# Patient Record
Sex: Male | Born: 1975 | Race: White | Hispanic: No | Marital: Married | Smoking: Never smoker
Health system: Southern US, Community
[De-identification: ages and names within clinical notes are randomized; demographics above are authoritative.]

## PROBLEM LIST (undated history)

## (undated) DIAGNOSIS — I10 Essential (primary) hypertension: Secondary | ICD-10-CM

## (undated) HISTORY — DX: Essential (primary) hypertension: I10

## (undated) HISTORY — PX: TONSILLECTOMY AND ADENOIDECTOMY: SUR1326

---

## 2002-01-08 DIAGNOSIS — I10 Essential (primary) hypertension: Secondary | ICD-10-CM

## 2002-01-08 HISTORY — DX: Essential (primary) hypertension: I10

## 2006-03-13 ENCOUNTER — Ambulatory Visit: Payer: Self-pay | Admitting: Family Medicine

## 2006-03-13 DIAGNOSIS — Z8679 Personal history of other diseases of the circulatory system: Secondary | ICD-10-CM | POA: Insufficient documentation

## 2006-03-13 DIAGNOSIS — F528 Other sexual dysfunction not due to a substance or known physiological condition: Secondary | ICD-10-CM

## 2006-03-14 ENCOUNTER — Encounter: Payer: Self-pay | Admitting: Family Medicine

## 2006-03-15 LAB — CONVERTED CEMR LAB
ALT: 13 units/L (ref 0–53)
Albumin: 5 g/dL (ref 3.5–5.2)
CO2: 24 meq/L (ref 19–32)
Chloride: 102 meq/L (ref 96–112)
Glucose, Bld: 84 mg/dL (ref 70–99)
MCV: 94.1 fL (ref 78.0–100.0)
Platelets: 260 10*3/uL (ref 150–400)
Potassium: 4.1 meq/L (ref 3.5–5.3)
RBC: 5.47 M/uL (ref 4.22–5.81)
Sex Hormone Binding: 9 nmol/L — ABNORMAL LOW (ref 13–71)
Sodium: 139 meq/L (ref 135–145)
TSH: 1.294 microintl units/mL (ref 0.350–5.50)
Testosterone Free: 102.8 pg/mL (ref 47.0–244.0)
Testosterone-% Free: 3.3 % — ABNORMAL HIGH (ref 1.6–2.9)
Testosterone: 309.47 ng/dL — ABNORMAL LOW (ref 350–890)
Total Bilirubin: 0.6 mg/dL (ref 0.3–1.2)
Total Protein: 7.7 g/dL (ref 6.0–8.3)
WBC: 9.6 10*3/uL (ref 4.0–10.5)

## 2006-03-18 ENCOUNTER — Telehealth: Payer: Self-pay | Admitting: Family Medicine

## 2006-03-25 ENCOUNTER — Encounter: Payer: Self-pay | Admitting: Family Medicine

## 2006-10-25 ENCOUNTER — Telehealth (INDEPENDENT_AMBULATORY_CARE_PROVIDER_SITE_OTHER): Payer: Self-pay | Admitting: *Deleted

## 2006-10-28 ENCOUNTER — Ambulatory Visit: Payer: Self-pay | Admitting: Family Medicine

## 2006-10-28 DIAGNOSIS — N453 Epididymo-orchitis: Secondary | ICD-10-CM | POA: Insufficient documentation

## 2007-02-17 ENCOUNTER — Ambulatory Visit: Payer: Self-pay | Admitting: Family Medicine

## 2007-02-17 DIAGNOSIS — R197 Diarrhea, unspecified: Secondary | ICD-10-CM

## 2007-11-04 ENCOUNTER — Ambulatory Visit: Payer: Self-pay | Admitting: Family Medicine

## 2007-11-04 DIAGNOSIS — D485 Neoplasm of uncertain behavior of skin: Secondary | ICD-10-CM | POA: Insufficient documentation

## 2007-11-17 ENCOUNTER — Telehealth: Payer: Self-pay | Admitting: Family Medicine

## 2007-11-26 ENCOUNTER — Ambulatory Visit: Payer: Self-pay | Admitting: Family Medicine

## 2008-05-26 ENCOUNTER — Telehealth: Payer: Self-pay | Admitting: Family Medicine

## 2008-06-02 ENCOUNTER — Telehealth: Payer: Self-pay | Admitting: Family Medicine

## 2009-01-26 ENCOUNTER — Ambulatory Visit: Payer: Self-pay | Admitting: Family Medicine

## 2010-01-04 ENCOUNTER — Encounter: Payer: Self-pay | Admitting: Family Medicine

## 2010-01-04 ENCOUNTER — Ambulatory Visit
Admission: RE | Admit: 2010-01-04 | Discharge: 2010-01-04 | Payer: Self-pay | Source: Home / Self Care | Attending: Family Medicine | Admitting: Family Medicine

## 2010-01-04 DIAGNOSIS — J019 Acute sinusitis, unspecified: Secondary | ICD-10-CM

## 2010-02-07 NOTE — Letter (Signed)
Summary: Out of Work  [facility]  [address], [address]   [place], [place] [address]   Phone: [phone]  Fax: [phone]    January 26, 2009   Employee:  NICHOLSON STARACE    To Whom It May Concern:   For Medical reasons, please excuse the above named employee from work for the following dates:  Start:   11-26-2009  End:   11-27-09  If you need additional information, please feel free to contact our office.         Sincerely,    Nani Gasser MD

## 2010-02-07 NOTE — Assessment & Plan Note (Signed)
Summary: Dyspnea   Vital Signs:  Patient profile:   35 year old male Height:      72 inches Weight:      218 pounds BMI:     29.67 O2 Sat:      98 % on Room air Pulse rate:   70 / minute BP sitting:   124 / 86  (left arm) Cuff size:   regular  Vitals Entered By: Kathlene November (January 26, 2009 9:02 AM)  O2 Flow:  Room air  Serial Vital Signs/Assessments:                                PEF    PreRx  PostRx Time      O2 Sat  O2 Type     L/min  L/min  L/min   By 9:19 AM                       450    350    390     Kim Johnson 9:28 AM                       500    500    450     Kim Johnson  Comments: 9:19 AM Pt in yellow zone By: Kathlene November  9:28 AM pt in green zone By: Kathlene November   CC: chest congestion, nasal congestion, back and chest tightness, some cough at times. Since Monday   Primary Care Provider:  Linford Arnold, C  CC:  chest congestion, nasal congestion, back and chest tightness, and some cough at times. Since Monday.  History of Present Illness: chest congestion, nasal congestion, back and chest tightness, some cough at 3 days of illness.   Since Monday.  Last time had similar sxs had PNA (about 5-6 yrs ago). No SOB.  Hard to breath deeply. Fatigued.  Nasal congestion started toay. INitialy just chest pain and tightness. No hx of asthma.  No fever.  No chills or sweats.  Taking mucinex and IBU for the pain.   Current Medications (verified): 1)  Levitra 2.5 Mg Tabs (Vardenafil Hcl) .... Take 1 Tablet By Mouth Once A Day As Needed  Allergies (verified): No Known Drug Allergies  Comments:  Nurse/Medical Assistant: The patient's medications and allergies were reviewed with the patient and were updated in the Medication and Allergy Lists. Kathlene November (January 26, 2009 9:03 AM)  Past History:  Past Medical History: Last updated: 03/13/2006 Hx HTN dx 2004.  Was able to lose 40 lbs and come off of BP meds.    Physical Exam  General:   Well-developed,well-nourished,in no acute distress; alert,appropriate and cooperative throughout examination Head:  Normocephalic and atraumatic without obvious abnormalities. No apparent alopecia or balding. Eyes:  No corneal or conjunctival inflammation noted. EOMI. Perrla.  Ears:  TMS are blocked by cerumen bilat.  Nose:  External nasal examination shows no deformity or inflammation. Nasal mucosa are pink and moist without lesions or exudates. Mouth:  Oral mucosa and oropharynx without lesions or exudates.  Teeth in good repair. Neck:  No deformities, masses, or tenderness noted. Lungs:  Prolonged expiration diffusely.  No crackles or wheeze.  Heart:  Normal rate and regular rhythm. S1 and S2 normal without gallop, murmur, click, rub or other extra sounds. Skin:  no rashes.   Cervical Nodes:  No lymphadenopathy noted Psych:  Cognition and  judgment appear intact. Alert and cooperative with normal attention span and concentration. No apparent delusions, illusions, hallucinations   Impression & Recommendations:  Problem # 1:  DYSPNEA (ICD-786.05) Pulse ox normal. Did have prlonged expiration on the exam. Peak flows in teh yellow and post neb tx in the green. Will treat with a zpack to cover for atypical PNA and gave an albuterol inhaler. Call if not better in 4-5 days or if has increased SOB call sooner.  Orders: Albuterol Sulfate Sol 1mg  unit dose (E4540) Nebulizer Tx (98119) Peak Flow Rate (94150)  Complete Medication List: 1)  Levitra 2.5 Mg Tabs (Vardenafil hcl) .... Take 1 tablet by mouth once a day as needed 2)  Zithromax Z-pak 250 Mg Tabs (Azithromycin) .... Take as directed 3)  Mini -inhaler On Teh $9 List.  .... 2-4 puffs every 4-6 hours as needed chest tightness or wheeze. Prescriptions: MINI -INHALER ON TEH $9 LIST. 2-4 puffs every 4-6 hours as needed chest tightness or wheeze.  #1 x 0   Entered and Authorized by:   Nani Gasser MD   Signed by:   Nani Gasser MD  on 01/26/2009   Method used:   Printed then faxed to ...       [facility] [phone]* (retail)       [address] [place], [place]  [address]       Ph: [phone]       Fax: [phone]   RxID:   [id number] Christena Deem Z-PAK 250 MG TABS (AZITHROMYCIN) Take as directed  #1 pack x 0   Entered and Authorized by:   Nani Gasser MD   Signed by:   Nani Gasser MD on 01/26/2009   Method used:   Electronically to        [facility] [phone]* (retail)       [address]       [place], [place]  [address]       Ph: [phone]       Fax: [phone]   RxID:   [id number]    Medication Administration  Medication # 1:    Medication: Albuterol Sulfate Sol 1mg  unit dose    Diagnosis: DYSPNEA (ICD-786.05)    Dose: 2.5mg     Route: inhaled    Exp Date: 09/09/2010    Lot #: [id number]    Mfr: Nephron    Patient tolerated medication without complications    Given by: Kathlene November (January 26, 2009 9:23 AM)  Orders Added: 1)  Albuterol Sulfate Sol 1mg  unit dose [J7613] 2)  Nebulizer Tx [94640] 3)  Est. Patient Level IV [70623] 4)  Peak Flow Rate [94150]

## 2010-02-09 NOTE — Letter (Signed)
Summary: Out of Work  [facility]  [address], [address]   [place], [place] [address]   Phone: [phone]  Fax: [phone]    January 04, 2010   Employee:  Randy Mitchell    To Whom It May Concern:   For Medical reasons, please excuse the above named employee from work for the following dates:  Start:   01-04-2010  End:   22-30-2011  If you need additional information, please feel free to contact our office.         Sincerely,    Nani Gasser MD

## 2010-02-09 NOTE — Assessment & Plan Note (Signed)
Summary: Sinusitis   Vital Signs:  Patient profile:   35 year old male Height:      72 inches Weight:      224 pounds Temp:     98.3 degrees F oral Pulse rate:   84 / minute BP sitting:   139 / 90  (right arm) Cuff size:   regular  Vitals Entered By: Avon Gully CMA, Duncan Dull) (January 04, 2010 2:33 PM) CC: aches,chills,congestion,fatigue, cold sxs on and off x 2 weeks   Primary Care Provider:  Linford Arnold, C  CC:  aches, chills, congestion, fatigue, and cold sxs on and off x 2 weeks.  History of Present Illness: aches,chills,congestion,fatigue, cold sxs on and off x 2 weeks.  Last 2 days have been the worse. Started with cough and congestion and now with fever, sinus congestion, aches and nausea. No SOB.  Taking Dayquail and IBU for the fever. Both ears have been hurting. Dec hearing in the left ear for hte last 2 days. + sick contacts.    Current Medications (verified): 1)  None  Allergies (verified): No Known Drug Allergies  Comments:  Nurse/Medical Assistant: The patient's medications and allergies were reviewed with the patient and were updated in the Medication and Allergy Lists. Avon Gully CMA, Duncan Dull) (January 04, 2010 2:34 PM)  Physical Exam  General:  Well-developed,well-nourished,in no acute distress; alert,appropriate and cooperative throughout examination Head:  Normocephalic and atraumatic without obvious abnormalities. No apparent alopecia or balding. Eyes:  No corneal or conjunctival inflammation noted. EOMI. Perrla.  Ears:  External ear exam shows no significant lesions or deformities.  Canals blocked by cerumen. Hearing is grossly normal bilaterally. Nose:  External nasal examination shows no deformity or inflammation. Nasal mucosa are pink and moist without lesions or exudates. Mouth:  Oral mucosa and oropharynx without lesions or exudates.  Teeth in good repair. Neck:  No deformities, masses, or tenderness noted. Lungs:  Normal respiratory  effort, chest expands symmetrically. Lungs are clear to auscultation, no crackles or wheezes. Heart:  Normal rate and regular rhythm. S1 and S2 normal without gallop, murmur, click, rub or other extra sounds. Skin:  no rashes.   Cervical Nodes:  Mild anterior cerv LN.  Psych:  Cognition and judgment appear intact. Alert and cooperative with normal attention span and concentration. No apparent delusions, illusions, hallucinations   Impression & Recommendations:  Problem # 1:  SINUSITIS - ACUTE-NOS (ICD-461.9)  The following medications were removed from the medication list:    Zithromax Z-pak 250 Mg Tabs (Azithromycin) .Marland Kitchen... Take as directed His updated medication list for this problem includes:    Amoxicillin 875 Mg Tabs (Amoxicillin) .Marland Kitchen... Take 1 tablet by mouth two times a day for 10 days  Instructed on treatment. Call if symptoms persist or worsen.   Complete Medication List: 1)  Amoxicillin 875 Mg Tabs (Amoxicillin) .... Take 1 tablet by mouth two times a day for 10 days  Patient Instructions: 1)  Call if not better in 7-10 days.  2)  Stay hydrated.  Prescriptions: AMOXICILLIN 875 MG TABS (AMOXICILLIN) Take 1 tablet by mouth two times a day for 10 days  #20 x 0   Entered and Authorized by:   Nani Gasser MD   Signed by:   Nani Gasser MD on 01/04/2010   Method used:   Electronically to        [facility] [phone]* (retail)       [address]       [place],  [place]  [address]       Ph: [phone]       Fax: [phone]   RxID:   [id number]    Orders Added: 1)  Est. Patient Level III [84132]

## 2010-09-15 ENCOUNTER — Telehealth: Payer: Self-pay | Admitting: *Deleted

## 2010-09-15 ENCOUNTER — Encounter: Payer: Self-pay | Admitting: Family Medicine

## 2010-09-15 ENCOUNTER — Ambulatory Visit (INDEPENDENT_AMBULATORY_CARE_PROVIDER_SITE_OTHER): Payer: PRIVATE HEALTH INSURANCE | Admitting: Family Medicine

## 2010-09-15 DIAGNOSIS — R51 Headache: Secondary | ICD-10-CM

## 2010-09-15 DIAGNOSIS — R5383 Other fatigue: Secondary | ICD-10-CM

## 2010-09-15 NOTE — Patient Instructions (Signed)
DASH diet, google it Trial of excedrin migraine for pain relief.

## 2010-09-15 NOTE — Progress Notes (Signed)
Subjective:    Patient ID: Randy Mitchell, male    DOB: 05/17/1975, 35 y.o.   MRN: [id number]  HPI Last week felt very fatigued and then Tuesday woke up with HA. Then started checking BP and has been running 140-150. Feels hot and flushed.  No change in bowels.  No fever. Daughter was sick.  Father with a h of HTN.  PT was on BP meds about 8 years ago but lost weight and he was able to lose 40 lbs. HA seems to get worse as the day progresses. No dietary changes. No CP or SOB.  Has been usning IBUprofen and not helping.  No vision changes. HA in the back of her head.  Has been really tired for about 1.5 weeks.  Stopped his caffeine intake about 3 days ago.  HA is about 3-4 /10 right now.  Says he feels hydrated.    Review of Systems BP 130/81  Pulse 79  Ht 6' (1.829 m)  Wt 220 lb (99.791 kg)  BMI 29.84 kg/m2    No Known Allergies  No past medical history on file.  No past surgical history on file.  History   Social History  . Marital Status: Married    Spouse Name: N/A    Number of Children: N/A  . Years of Education: N/A   Occupational History  . Not on file.   Social History Main Topics  . Smoking status: Never Smoker   . Smokeless tobacco: Not on file  . Alcohol Use: Not on file  . Drug Use: Not on file  . Sexually Active: Not on file   Other Topics Concern  . Not on file   Social History Narrative  . No narrative on file    No family history on file.  Randy Mitchell does not currently have medications on file.     Objective:   Physical Exam  Constitutional: He is oriented to person, place, and time. He appears well-developed and well-nourished.  HENT:  Head: Normocephalic and atraumatic.  Eyes: Conjunctivae are normal. Pupils are equal, round, and reactive to light.  Neck: Neck supple. No thyromegaly present.  Cardiovascular: Normal rate, regular rhythm and normal heart sounds.   Pulmonary/Chest: Effort normal and breath sounds normal.  Musculoskeletal: He  exhibits no edema.  Lymphadenopathy:    He has no cervical adenopathy.  Neurological: He is alert and oriented to person, place, and time. He displays normal reflexes. No cranial nerve deficit.  Skin: Skin is warm and dry.  Psychiatric: He has a normal mood and affect. His behavior is normal.          Assessment & Plan:  HA - Trial of excedrin for pain relief over the weekend since the IBU is not working.  Work on low salt diet. He has already stopped his caffeine and is hydrating well. Today his blood pressure actually looks well controlled. I hesitate to put him on a blood pressure mentation even though it has been running a little higher at home. If his blood pressure still elevated on Monday and asked that he call the office and we will start a low-dose diuretic and see if this improves his symptoms. He has no other URI symptoms to suggest a sinusitis.  Fatigue - we will check a CBC to rule out anemia. Was also check his thyroid gland. Also check a CMP to rule out an electrolyte abnormality. This could also be viral as he was exposed to start he had  a URI last week.

## 2010-09-16 LAB — COMPLETE METABOLIC PANEL WITH GFR
AST: 17 U/L (ref 0–37)
Albumin: 4.5 g/dL (ref 3.5–5.2)
CO2: 20 mEq/L (ref 19–32)
Calcium: 9.4 mg/dL (ref 8.4–10.5)
Sodium: 138 mEq/L (ref 135–145)
Total Protein: 7.5 g/dL (ref 6.0–8.3)

## 2010-09-16 LAB — CBC WITH DIFFERENTIAL/PLATELET
Basophils Absolute: 0.1 10*3/uL (ref 0.0–0.1)
Basophils Relative: 1 % (ref 0–1)
Eosinophils Absolute: 0.1 10*3/uL (ref 0.0–0.7)
Eosinophils Relative: 1 % (ref 0–5)
HCT: 48.5 % (ref 39.0–52.0)
MCH: 30.5 pg (ref 26.0–34.0)
MCHC: 33.4 g/dL (ref 30.0–36.0)
MCV: 91.3 fL (ref 78.0–100.0)
Monocytes Absolute: 1.1 10*3/uL — ABNORMAL HIGH (ref 0.1–1.0)
Neutro Abs: 5.1 10*3/uL (ref 1.7–7.7)
RDW: 12.8 % (ref 11.5–15.5)

## 2010-09-16 LAB — TSH: TSH: 1.435 u[IU]/mL (ref 0.350–4.500)

## 2010-09-18 ENCOUNTER — Telehealth: Payer: Self-pay | Admitting: Family Medicine

## 2010-09-18 MED ORDER — HYDROCHLOROTHIAZIDE 12.5 MG PO TABS: 12.5000 mg | ORAL_TABLET | Freq: Every day | ORAL | Status: DC

## 2010-09-18 NOTE — Telephone Encounter (Signed)
Call patient: Complete metabolic panel and thyroid are normal. Blood count is normal. Vitamin B12 is definitely on the low end of normal. I recommend starting a once a day vitamin B12 supplement and we can recheck his level in 3-4 months to make sure he is responding to the oral medication. This will hopefully help his fatigue that he complained of. If he is still having elevated blood pressure when you speak with him today please let me know as I told him we would call in a low dose diuretic.

## 2010-09-18 NOTE — Telephone Encounter (Signed)
rx sent

## 2010-09-18 NOTE — Telephone Encounter (Signed)
Pt notified. Pt states over the weekend his BP was normal 120/80's today his BPis 143/93 and his HA's have persisted.

## 2010-11-16 ENCOUNTER — Other Ambulatory Visit: Payer: Self-pay | Admitting: Family Medicine

## 2011-01-17 ENCOUNTER — Telehealth: Payer: Self-pay | Admitting: *Deleted

## 2011-01-17 NOTE — Telephone Encounter (Signed)
Pt call states that he has had sinus congestion for 2 weeks and now he can't hear out of the left ear. Would like to know if you can call a med in or do you prefer he come in to be seen. Please advise.

## 2011-01-17 NOTE — Telephone Encounter (Signed)
Needs appt for tomorrow

## 2011-01-17 NOTE — Telephone Encounter (Signed)
Informed pt to make appt

## 2011-01-18 ENCOUNTER — Ambulatory Visit (INDEPENDENT_AMBULATORY_CARE_PROVIDER_SITE_OTHER): Payer: PRIVATE HEALTH INSURANCE | Admitting: Family Medicine

## 2011-01-18 ENCOUNTER — Encounter: Payer: Self-pay | Admitting: Family Medicine

## 2011-01-18 VITALS — BP 131/88 | HR 80 | Temp 97.9°F | Ht 71.0 in | Wt 220.0 lb

## 2011-01-18 DIAGNOSIS — H9190 Unspecified hearing loss, unspecified ear: Secondary | ICD-10-CM

## 2011-01-18 DIAGNOSIS — H6123 Impacted cerumen, bilateral: Secondary | ICD-10-CM

## 2011-01-18 DIAGNOSIS — H6120 Impacted cerumen, unspecified ear: Secondary | ICD-10-CM

## 2011-01-18 DIAGNOSIS — H9209 Otalgia, unspecified ear: Secondary | ICD-10-CM

## 2011-01-18 DIAGNOSIS — H9193 Unspecified hearing loss, bilateral: Secondary | ICD-10-CM

## 2011-01-18 DIAGNOSIS — J0100 Acute maxillary sinusitis, unspecified: Secondary | ICD-10-CM

## 2011-01-18 MED ORDER — FLUTICASONE PROPIONATE 50 MCG/ACT NA SUSP: 2.0000 | Freq: Every day | NASAL | Status: DC

## 2011-01-18 MED ORDER — AMOXICILLIN-POT CLAVULANATE 875-125 MG PO TABS: 1.0000 | ORAL_TABLET | Freq: Two times a day (BID) | ORAL | Status: AC

## 2011-01-18 MED ORDER — CARBAMIDE PEROXIDE 6.5 % OT SOLN: 5.0000 [drp] | Freq: Two times a day (BID) | OTIC | Status: AC

## 2011-01-18 MED ORDER — CETIRIZINE-PSEUDOEPHEDRINE ER 5-120 MG PO TB12: 1.0000 | ORAL_TABLET | Freq: Two times a day (BID) | ORAL | Status: AC

## 2011-01-18 NOTE — Progress Notes (Signed)
Subjective:     Patient ID: Randy Mitchell, male   DOB: 04-14-1975, 36 y.o.   MRN: [id number]  Sinusitis This is a new problem. The current episode started 1 to 4 weeks ago. The problem has been gradually worsening since onset. There has been no fever. The pain is mild. Associated symptoms include congestion, coughing, ear pain, sinus pressure and swollen glands. Pertinent negatives include no headaches, sneezing or sore throat. Past treatments include oral decongestants. The treatment provided no relief.     Review of Systems  Constitutional: Positive for fatigue.  HENT: Positive for ear pain, congestion and sinus pressure. Negative for sore throat and sneezing.   Respiratory: Positive for cough.   Neurological: Negative for headaches.  All other systems reviewed and are negative.      BP 131/88  Pulse 80  Temp(Src) 97.9 F (36.6 C) (Oral)  Ht 5\' 11"  (1.803 m)  Wt 220 lb (99.791 kg)  BMI 30.68 kg/m2  SpO2 98% Objective:   Physical Exam  Nursing note and vitals reviewed. Constitutional: He is oriented to person, place, and time. He appears well-developed and well-nourished. No distress.  HENT:  Head: Normocephalic and atraumatic.  Right Ear: External ear normal. No decreased hearing is noted.  Left Ear: External ear normal. No decreased hearing is noted.  Nose: Mucosal edema and rhinorrhea present. Right sinus exhibits maxillary sinus tenderness. Left sinus exhibits maxillary sinus tenderness.  Mouth/Throat: Oropharynx is clear and moist. No oral lesions. No oropharyngeal exudate.       Complete blockage of both ears w/excessive amount of wax.  Eyes: Right eye exhibits no discharge. Left eye exhibits no discharge. No scleral icterus.  Neck: Neck supple.  Cardiovascular: Normal rate, regular rhythm and normal heart sounds.   Pulmonary/Chest: Effort normal and breath sounds normal. He has no wheezes. He has no rales.  Lymphadenopathy:    He has no cervical adenopathy.    Neurological: He is alert and oriented to person, place, and time.  Skin: Skin is warm and dry.       Assessment:     Sinusitis, ear pain , hearing loss and excessive cerumen    Plan:     Augmentin 2 x a day,  Zyrtec-D ,flonase nasal spray, debrox ear wax softner Return in 1-2 weeks for irrigation of ears

## 2011-01-18 NOTE — Patient Instructions (Signed)
Cerumen Impaction A cerumen impaction is when the wax in your ear forms a plug. This plug usually causes reduced hearing. Sometimes it also causes an earache or dizziness. Removing a cerumen impaction can be difficult and painful. The wax sticks to the ear canal. The canal is sensitive and bleeds easily. If you try to remove a heavy wax buildup with a cotton tipped swab, you may push it in further. Irrigation with water, suction, and small ear curettes may be used to clear out the wax. If the impaction is fixed to the skin in the ear canal, ear drops may be needed for a few days to loosen the wax. People who build up a lot of wax frequently can use ear wax removal products available in your local drugstore. SEEK MEDICAL CARE IF:  You develop an earache, increased hearing loss, or marked dizziness. Document Released: 02/02/2004 Document Revised: 09/06/2010 Document Reviewed: 03/24/2009 Exit[employer] Patient Information 2012 [employer]. 

## 2011-02-01 ENCOUNTER — Ambulatory Visit: Payer: PRIVATE HEALTH INSURANCE | Admitting: Family Medicine

## 2011-11-08 ENCOUNTER — Ambulatory Visit (INDEPENDENT_AMBULATORY_CARE_PROVIDER_SITE_OTHER): Payer: PRIVATE HEALTH INSURANCE | Admitting: Family Medicine

## 2011-11-08 ENCOUNTER — Encounter: Payer: Self-pay | Admitting: Family Medicine

## 2011-11-08 VITALS — BP 123/84 | HR 83 | Temp 98.3°F | Ht 71.0 in | Wt 237.0 lb

## 2011-11-08 DIAGNOSIS — R51 Headache: Secondary | ICD-10-CM

## 2011-11-08 DIAGNOSIS — M79603 Pain in arm, unspecified: Secondary | ICD-10-CM

## 2011-11-08 DIAGNOSIS — M79609 Pain in unspecified limb: Secondary | ICD-10-CM

## 2011-11-08 DIAGNOSIS — R079 Chest pain, unspecified: Secondary | ICD-10-CM

## 2011-11-08 DIAGNOSIS — R0789 Other chest pain: Secondary | ICD-10-CM

## 2011-11-08 MED ORDER — AMOXICILLIN-POT CLAVULANATE 875-125 MG PO TABS: 1.0000 | ORAL_TABLET | Freq: Two times a day (BID) | ORAL | Status: DC

## 2011-11-08 NOTE — Patient Instructions (Addendum)
Call me if not some better by Tuesday or Wednesay. Call sooner if still having chest discomfort.

## 2011-11-08 NOTE — Progress Notes (Signed)
  Subjective:    Patient ID: Randy Mitchell, male    DOB: 1975-06-26, 36 y.o.   MRN: [id number]  HPI Did have some  CP for the last 2 days that was mild on the left side.  Pain would last a few minutes min nad then resolve and then recur. No known triggers, such as exetion. Better today. NO SOB or cough.  For a week has had some right arm pain but not this week. Says feels like slept on arm the wrong way. No numbness. More like an achiness. Taking IBU regularly.  HA has been his worst sxs behind his eyes. Worse a little when he bends over. No throbbing.  No nuase or light sensitivity.  Has had it constantly for 1.5 weeks.  Hasn't gone away at all. Says feel more fatigue.  No fever or sweats.  No facial tenderness.  No ear pain or pressure or feeling full.  Had one day where felt lightheaded.     Review of Systems     Objective:   Physical Exam  Constitutional: He is oriented to person, place, and time. He appears well-developed and well-nourished.  HENT:  Head: Normocephalic and atraumatic.  Right Ear: External ear normal.  Left Ear: External ear normal.  Nose: Nose normal.  Mouth/Throat: Oropharynx is clear and moist.       TMs and canals are clear.   Eyes: Conjunctivae normal and EOM are normal. Pupils are equal, round, and reactive to light.  Neck: Neck supple. No thyromegaly present.  Cardiovascular: Normal rate and normal heart sounds.   Pulmonary/Chest: Effort normal and breath sounds normal.  Lymphadenopathy:    He has no cervical adenopathy.  Neurological: He is alert and oriented to person, place, and time.  Skin: Skin is warm and dry.  Psychiatric: He has a normal mood and affect.          Assessment & Plan:  Atypical CP - unclear etiology at this point. There is no cough or fever to suggest pneumonia. His lung exam is normal today. I don't think this is cardiac because the pain is atypical and his EKG is normal today. Please see below for description. He actually feels  better his chain chest pain absent today. I did ask him to call the office back if he gets recurrence of his pain. Unlikely to be GERD because the pain is definitely to the left of the chest and not towards the center. Consider chest x-ray if his symptoms persist.  HA - suspect sinusiits, that he is not having classic symptoms of significant nasal congestion and fever. The fact that his headaches have been persistent and they do feel worse when he leans forward, then I am suspicious of sinusitis.Maryclare Labrador get a CBC with differential today. Also check electrolyte abnormalities. We'll go ahead and place him on Augmentin for 10 days. If she's not better by Monday or Tuesday of next week and please call the office back and let me know.   EKG - rate of 74 beats per minute, normal sinus rhythm, no acute changes. Normal axis.

## 2011-11-09 LAB — COMPLETE METABOLIC PANEL WITH GFR
ALT: 30 U/L (ref 0–53)
BUN: 14 mg/dL (ref 6–23)
CO2: 28 mEq/L (ref 19–32)
Calcium: 9.6 mg/dL (ref 8.4–10.5)
Chloride: 104 mEq/L (ref 96–112)
Creat: 0.95 mg/dL (ref 0.50–1.35)
GFR, Est African American: >89 mL/min
GFR, Est Non African American: >89 mL/min
Total Bilirubin: 0.9 mg/dL (ref 0.3–1.2)

## 2011-11-09 LAB — CBC WITH DIFFERENTIAL/PLATELET
Basophils Absolute: 0.1 10*3/uL (ref 0.0–0.1)
Eosinophils Absolute: 0.1 10*3/uL (ref 0.0–0.7)
Eosinophils Relative: 1 % (ref 0–5)
HCT: 43.7 % (ref 39.0–52.0)
Lymphs Abs: 2.9 10*3/uL (ref 0.7–4.0)
MCH: 31.3 pg (ref 26.0–34.0)
MCV: 88.1 fL (ref 78.0–100.0)
Monocytes Absolute: 1 10*3/uL (ref 0.1–1.0)
Platelets: 276 10*3/uL (ref 150–400)
RDW: 13.4 % (ref 11.5–15.5)

## 2011-11-09 NOTE — Progress Notes (Signed)
Quick Note:  All labs are normal. ______ 

## 2011-11-14 ENCOUNTER — Encounter: Payer: Self-pay | Admitting: *Deleted

## 2012-10-29 ENCOUNTER — Encounter: Payer: Self-pay | Admitting: Family Medicine

## 2012-10-29 ENCOUNTER — Ambulatory Visit (INDEPENDENT_AMBULATORY_CARE_PROVIDER_SITE_OTHER): Payer: PRIVATE HEALTH INSURANCE | Admitting: Family Medicine

## 2012-10-29 VITALS — BP 136/81 | HR 77 | Wt 245.0 lb

## 2012-10-29 DIAGNOSIS — M545 Low back pain, unspecified: Secondary | ICD-10-CM

## 2012-10-29 MED ORDER — CYCLOBENZAPRINE HCL 10 MG PO TABS: 10.0000 mg | ORAL_TABLET | Freq: Every evening | ORAL | Status: DC | PRN

## 2012-10-29 NOTE — Progress Notes (Signed)
Subjective:    Patient ID: Randy Mitchell, male    DOB: Oct 03, 1975, 37 y.o.   MRN: [id number]  HPI Low Back Pain - x 1 wk no injury just woke up in pain. hurts with activity using 200mg  of IBU cannot tell difference. bilat low pain.  No radiation into legs.  Pain is 6/10. No trauma or injury. No prior issues with his back.  Tried heating pad - but no relief.  No fevers, chills, sweats or recent illnesses.  He says it typically feels more stiff in the morning and gets a little bit better as the day goes on. He does do some occasional heavy lifting at work.  BP 136/81  Pulse 77  Wt 245 lb (111.131 kg)  BMI 34.19 kg/m2    No Known Allergies  Past Medical History  Diagnosis Date  . Hypertension 2004    was able to lose 40lbs and come off of BP  meds    Past Surgical History  Procedure Laterality Date  . Tonsillectomy and adenoidectomy      History   Social History  . Marital Status: Married    Spouse Name: N/A    Number of Children: N/A  . Years of Education: N/A   Occupational History  . Not on file.   Social History Main Topics  . Smoking status: Never Smoker   . Smokeless tobacco: Not on file  . Alcohol Use: Yes  . Drug Use: No  . Sexual Activity: Not on file   Other Topics Concern  . Not on file   Social History Narrative  . No narrative on file    Family History  Problem Relation Age of Onset  . Hyperlipidemia Father   . Hypertension Father   . Stroke Father   . Hypertension Brother   . Stroke Brother   . Cancer Maternal Grandfather     lung  . Heart disease Maternal Grandfather     Outpatient Encounter Prescriptions as of 10/29/2012  Medication Sig Dispense Refill  . amoxicillin-clavulanate (AUGMENTIN) 875-125 MG per tablet Take 1 tablet by mouth 2 (two) times daily.  20 tablet  0  . cyclobenzaprine (FLEXERIL) 10 MG tablet Take 1 tablet (10 mg total) by mouth at bedtime as needed for muscle spasms.  20 tablet  0  . fluticasone (FLONASE) 50 MCG/ACT  nasal spray Place 2 sprays into the nose daily.  1 g  0  . hydrochlorothiazide (HYDRODIURIL) 12.5 MG tablet Take 12.5 mg by mouth daily.       No facility-administered encounter medications on file as of 10/29/2012.        Review of Systems     Objective:   Physical Exam  Constitutional: He is oriented to person, place, and time. He appears well-developed and well-nourished.  HENT:  Head: Normocephalic and atraumatic.  Musculoskeletal:   Normal lumbar flexion, essentially, rotation right and left, side biting. He did have significant discomfort with forward flexion. He's also tender over the right SI joint. Nontender over the lumbar spine or paraspinous muscles. Hip, knee, ankle strength is 5 out of 5 bilaterally. Patellar reflexes 2+ bilaterally.  Neurological: He is alert and oriented to person, place, and time.  Skin: Skin is warm and dry.  Psychiatric: He has a normal mood and affect.          Assessment & Plan:  Low back strain-will increase ibuprofen to 600 mg up to 3 times a day. Take with food and water. Stop immediately  if any GI upset or irritation. We'll add a low-dose muscle relaxer at bedtime to use as needed. Warned about potential side effects and sedative nature of the drug. Work note given so that he does not lift more than 15 pounds in the next 2 weeks to get his back a chance to heal. If she's not improving over the next 2-3 weeks then please followup. Handout given on stretches to do on his own at home.

## 2012-10-29 NOTE — Patient Instructions (Signed)
Ibuprofen 600mg  3 x a day with food and water. Stop if any stomach upset or pain. No heavy lifting for 2 weeks.   Call if not improving over the next 3 weeks See exercises on sheet Can use the muscle relaxer at bedtime.

## 2013-05-21 ENCOUNTER — Ambulatory Visit (INDEPENDENT_AMBULATORY_CARE_PROVIDER_SITE_OTHER): Payer: Managed Care, Other (non HMO) | Admitting: Sports Medicine

## 2013-05-21 ENCOUNTER — Encounter: Payer: Self-pay | Admitting: Sports Medicine

## 2013-05-21 ENCOUNTER — Ambulatory Visit (INDEPENDENT_AMBULATORY_CARE_PROVIDER_SITE_OTHER): Payer: Managed Care, Other (non HMO)

## 2013-05-21 VITALS — BP 136/78 | HR 67 | Ht 71.0 in | Wt 232.0 lb

## 2013-05-21 DIAGNOSIS — S39012A Strain of muscle, fascia and tendon of lower back, initial encounter: Secondary | ICD-10-CM

## 2013-05-21 DIAGNOSIS — M62830 Muscle spasm of back: Secondary | ICD-10-CM | POA: Insufficient documentation

## 2013-05-21 DIAGNOSIS — M5380 Other specified dorsopathies, site unspecified: Secondary | ICD-10-CM

## 2013-05-21 DIAGNOSIS — S335XXA Sprain of ligaments of lumbar spine, initial encounter: Secondary | ICD-10-CM

## 2013-05-21 DIAGNOSIS — M549 Dorsalgia, unspecified: Secondary | ICD-10-CM

## 2013-05-21 MED ORDER — PREDNISONE 50 MG PO TABS: ORAL_TABLET | ORAL | Status: DC

## 2013-05-21 MED ORDER — MELOXICAM 15 MG PO TABS: ORAL_TABLET | ORAL | Status: DC

## 2013-05-21 NOTE — Assessment & Plan Note (Signed)
Right-sided, seems to be predominantly trapezius/lats. Prednisone, Mobic. Home rehabilitation exercises. Light duty for the next 2 weeks. Return to see me as needed.

## 2013-05-21 NOTE — Progress Notes (Signed)
   Subjective:    I'm seeing this patient as a consultation for:  Dr. Madilyn Fireman  CC: Low back pain  HPI: This is a very pleasant 38 year old male, yesterday he was at work, tried to pull a large object and felt a pain in his right periscapular region. The pain was mild, and he was told by his supervisor to come see [place]. Pain is now improving significantly very no radiation.  Past medical history, Surgical history, Family history not pertinant except as noted below, Social history, Allergies, and medications have been entered into the medical record, reviewed, and no changes needed.   Review of Systems: No headache, visual changes, nausea, vomiting, diarrhea, constipation, dizziness, abdominal pain, skin rash, fevers, chills, night sweats, weight loss, swollen lymph nodes, body aches, joint swelling, muscle aches, chest pain, shortness of breath, mood changes, visual or auditory hallucinations.   Objective:   General: Well Developed, well nourished, and in no acute distress.  Neuro/Psych: Alert and oriented x3, extra-ocular muscles intact, able to move all 4 extremities, sensation grossly intact. Skin: Warm and dry, no rashes noted.  Respiratory: Not using accessory muscles, speaking in full sentences, trachea midline.  Cardiovascular: Pulses palpable, no extremity edema. Abdomen: Does not appear distended. Right Shoulder: Inspection reveals no abnormalities, atrophy or asymmetry. Palpation is normal with no tenderness over AC joint or bicipital groove. ROM is full in all planes. Rotator cuff strength normal throughout. No signs of impingement with negative Neer and Hawkin's tests, empty can sign. Speeds and Yergason's tests normal. No labral pathology noted with negative Obrien's, negative clunk and good stability. Normal scapular function observed. No painful arc and no drop arm sign. No apprehension sign Back Exam:  Inspection: Unremarkable  Motion: Flexion 45 deg, Extension 45 deg,  Side Bending to 45 deg bilaterally,  Rotation to 45 deg bilaterally  SLR laying: Negative  XSLR laying: Negative  Palpable tenderness: None. FABER: negative. Sensory change: Gross sensation intact to all lumbar and sacral dermatomes.  Reflexes: 2+ at both patellar tendons, 2+ at achilles tendons, Babinski's downgoing.  Strength at foot  Plantar-flexion: 5/5 Dorsi-flexion: 5/5 Eversion: 5/5 Inversion: 5/5  Leg strength  Quad: 5/5 Hamstring: 5/5 Hip flexor: 5/5 Hip abductors: 5/5  Gait unremarkable.  Thoracolumbar x-rays were reviewed and are negative.  Impression and Recommendations:   This case required medical decision making of moderate complexity.

## 2015-01-05 ENCOUNTER — Ambulatory Visit (INDEPENDENT_AMBULATORY_CARE_PROVIDER_SITE_OTHER): Payer: BLUE CROSS/BLUE SHIELD | Admitting: Osteopathic Medicine

## 2015-01-05 ENCOUNTER — Encounter: Payer: Self-pay | Admitting: Osteopathic Medicine

## 2015-01-05 VITALS — BP 139/99 | HR 95 | Temp 98.2°F | Ht 71.0 in | Wt 278.0 lb

## 2015-01-05 DIAGNOSIS — J069 Acute upper respiratory infection, unspecified: Secondary | ICD-10-CM | POA: Diagnosis not present

## 2015-01-05 MED ORDER — METHYLPREDNISOLONE 4 MG PO TBPK: ORAL_TABLET | ORAL | Status: DC

## 2015-01-05 MED ORDER — HYDROCODONE-HOMATROPINE 5-1.5 MG/5ML PO SYRP: 5.0000 mL | ORAL_SOLUTION | Freq: Three times a day (TID) | ORAL | Status: DC | PRN

## 2015-01-05 MED ORDER — BENZONATATE 200 MG PO CAPS: 200.0000 mg | ORAL_CAPSULE | Freq: Two times a day (BID) | ORAL | Status: DC | PRN

## 2015-01-05 NOTE — Patient Instructions (Signed)
DR. Rawlin Reaume'S HOME CARE INSTRUCTIONS: UPPER RESPIRATORY ILLNESS AND SINUSITIS   FRIST, A FEW NOTES ON OVER-THE-COUNTER MEDICATIONS!  . USE CAUTION - MANY OVER-THE-COUNTER MEDICATIONS COME IN COMBINATIONS OF MULTIPLE GENERICS. FOR INSTANCE, NYQUIL HAS TYLENOL + COUGH MEDICINE + AN ANTIHISTAMINE, SO BE CAREFUL YOU'RE NOT TAKING A COMBINATION MEDICINE WHICH CONTAINS MEDICATIONS YOU'RE ALSO TAKING SEPARATELY (LIKE NYQUIL SYRUP AS WELL AS TYLENOL PILLS).  . YOUR PHARMACIST CAN HELP YOU AVOID MEDICATION INTERACTIONS AND DUPLICATIONS - ASK FOR THEIR HELP IF YOU ARE CONFUSED OR UNSURE ABOUT WHAT TO PURCHASE OVER-THE-COUNTER!  . REMEMBER - IF YOU'RE EVER CONCERNED ABOUT MEDICATION SIDE EFFECTS, OR IF YOU'RE EVER CONCERNED YOUR SYMPTOMS ARE GETTING WORSE DESPITE TREATMENT, PLEASE CALL THE OFFICE!   TREAT SINUS CONGESTION, RUNNY NOSE & POSTNASAL DRIP: . Treat to increase airflow through sinuses, decrease congestion pain and prevent bacterial growth!  . Remember, only 0.5-2% of sinus infections are due to a bacteria, the rest are due to a virus (usually the common cold)! Trust your doctor when he or she decides whether or not you really need an antibiotic!   NASAL SPRAYS: generally safe and should not interact with other medicines. Can take any of these medications, either alone or together... FLONASE (FLUTICASONE) - 2 sprays in each nostril twice per day (also a great allergy medicine to use long-term!) AFRIN (OXYMETOLAZONE) - use sparingly because it will cause rebound congestion, NEVER USE IN KIDS   SALINE NASAL SPRAY- no limit, but avoid use after other nasal sprays or it can wash the medicine away  ANTIHISTAMINES: Helps dry out runny nose and decreases postnasal drip. Benadryl may cause drowsiness but other preparations should not be as sedating. Certain kinds are not as safe in elderly individuals. OK to use unless Dr A says otherwise.  Only use one of the following... BENADRYL (DIPHENHYDRAMINE) -  25-50 mg every 6 hours ZYRTEC (CETIRIZINE) - 5-10 mg daily CLARITIN (LORATIDINE) - less potent. 10 mg daily ALLEGRA (FEXOFENADINE) - least likely to cause drowsiness! 180 mg daily or 60 mg twice per day  DECONGESTANTS: Helps dry out runny nose and helps with sinus pain. May cause insomnia, or sometimes elevated heart rate. Can cause problems if used often in people with high blood pressure. OK to use unless Dr A says otherwise. NEVER USE IN KIDS UNDER 2 YEARS OLD. Only use one of the following... SUDAFED (PSEUDOEPHEDINE) - 60 mg every 4 - 6 hours, also comes in 120 mg extended release every 12 hours, maximum 240 mg in 24 hours.  SUDAED PE (PHENYLEPHRINE) - 10 mg every 4 - 6 hours, maximum 60 mg per day  COMBINATIONS OF ANTIHISTAMINE + DECONGESTANT: these usually require you to show your ID at the pharmacy counter. You can also purchase these medicines separately as noted above.  Only use one of the following... ZYRTEC-D (CETIRIZINE + PSEUDOEPHEDRINE) - 12 hour formulation as directed CLARITIN-D (LORATIDINE + PSEUDOEPHEDRINE) - 12 and 24 hour formulations as directed ALLEGRA-D (FEXOFENADINE + PSEUDOEPHEDRINE) - 12 and 24 hour formulations as directed  PRESCRIPTION TREATMENT FOR SINUS PROBLEMS: Can include nasal sprays, pills, or antibiotics in the case of true bacterial infection. Not everyone needs an antibiotic but there are other medicines which will help you feel better while your body fights the infection!   TREAT COUGH & SORE THROAT: . Remember, cough is the body's way of protecting your airways and lungs, it's a hard-wired reflex that is tough for medicines to treat!  . Irritation to the airways will cause   cough. This irritation is usually caused by upper airway problems like postnasal drip (treat as above) and viral sore throat, but in severe cases can be due to lower airway problems like bronchitis or pneumonia, which a doctor can usually hear on exam of your lungs, or see on an  X-ray. . Sore throat is almost always due to a virus, but occasionally caused by Strep, which requires antibiotics.  . Exercise and smoking may make cough worse - take it easy while you're sick, and QUIT SMOKING!   . Cough due to viral infection can linger for 2 - 3 weeks or so. If you're coughing longer than you think you should, or if the cough is severe, please make an appointment in the office - you may need a chest X-ray.   EXPECTORANT: Used to help clear airways, take these with PLENTY of water to help thin mucus secretions and make the mucus easier to cough up   Only use one of the following... ROBITUSSIN (DEXTROMETHORPHAN OR GUAIFENISEN depending on formulation)  MUCINEX (GUAIFENICEN) - usually longer acting  COUGH DROPS/LOZENGES: Whichever over-the-counter agent you prefer!  Here are some suggestions for ingredients to look for (can take both)... BENZOCAINE - numbing effect, also in [place] - cooling effect  HONEY: has gone head-to-head in several clinical trials with prescription cough medicines and found to be equally effective! Try 1 Teaspoon Honey + 2 Drops Lemon Juice, as much as you want to use. NONE FOR KIDS UNDER AGE 58  HERBAL TEA: There are certain ingredients which help "coat the throat" to relieve pain  such as ELM BARK, LICORICE ROOT, MARSHMALLOW ROOT  PRESCRIPTION TREATMENT FOR COUGH: Reserved for severe cases. Can include pills, syrups or inhalers.    TREAT ACHES & PAINS, FEVER: . Illness causes aches, pains and fever as your body increases its natural inflammation response to help fight the infection.  . Rest, good hydration and nutrition, and taking anti-inflammatory medications will help.  . Remember: a true fever is a temperature 100.4 or higher. If you have a fever that is 105.0 or higher, that is a dangerous level and needs medical attention in the office or in the ER!    Can take both of these together... IBUPROFEN - 400-800 mg  every 6 - 8 hours. Ibuprofen and similar medications can cause problems for people with heart disease or history of stomach ulcers, check with Dr A first if you're concerned. Lower doses are usually safe and effective.  TYLENOL (ACETAMINOPHEN) - [phone] mg every 6 hours. It won't cause problems with heart or stomach.     REMEMBER - THE MOST IMPORTANT THINGS YOU CAN DO TO AVOID CATCHING OR SPREADING ILLNESS INCLUDE:  . COVER YOUR COUGH WITH YOUR ARM, NOT WITH YOUR HANDS!  . DISINFECT COMMONLY USED SURFACES (SUCH AS TELEPHONES & DOORKNOBS) WHEN YOU OR SOMEONE CLOSE TO YOU IS FEELING SICK!  . BE SURE VACCINES ARE UP TO DATE - GET A FLU SHOT EVERY YEAR! . GOOD NUTRITION AND HEALTHY LIFESTYLE WILL HELP YOUR IMMUNE SYSTEM YEAR-ROUND! . AND ABOVE ALL - [place]!

## 2015-01-05 NOTE — Progress Notes (Signed)
HPI: Randy Mitchell is a 39 y.o. male who presents to [facility]  today for chief complaint of:  Chief Complaint  Patient presents with  . Cough    . Location: sinuses, congestion . Quality: pressure, congestion, coughing . Severity: moderate . Duration: 2 weeks, started with coughing then congestion cam  . Context: no sick contacts, no recent travel. Felt a bit better about a week ago then started to get worse again . Marland Kitchen Modifying factors: has tried the following OTC medications: "cold meds"  otc  without relief . Assoc signs/symptoms: no fever/chills, yes productive cough, come body aches, Some GI upset with periodic loose stool    Past medical, social and family history reviewed: Past Medical History  Diagnosis Date  . Hypertension 2004    was able to lose 40lbs and come off of BP  meds   Past Surgical History  Procedure Laterality Date  . Tonsillectomy and adenoidectomy     Social History  Substance Use Topics  . Smoking status: Never Smoker   . Smokeless tobacco: Not on file  . Alcohol Use: Yes   Family History  Problem Relation Age of Onset  . Hyperlipidemia Father   . Hypertension Father   . Stroke Father   . Hypertension Brother   . Stroke Brother   . Cancer Maternal Grandfather     lung  . Heart disease Maternal Grandfather     No current outpatient prescriptions on file.   No current facility-administered medications for this visit.   No Known Allergies    Review of Systems: CONSTITUTIONAL: no fever/chills HEAD/EYES/EARS/NOSE/THROAT: yes headache, no vision change or hearing change, yes sore throat CARDIAC: No chest pain/pressure/palpitations, no orthopnea RESPIRATORY: yes cough, no shortness of breath GASTROINTESTINAL: no nausea, no vomiting, mild loose stool, no abdominal pain/blood in stool/constipation MUSCULOSKELETAL: yes myalgia/arthralgia   Exam:  BP 139/99 mmHg  Pulse 95  Ht 5\' 11"  (1.803 m)  Wt 278  lb (126.1 kg)  BMI 38.79 kg/m2 Constitutional: VSS, see above. General Appearance: alert, well-developed, well-nourished, NAD Eyes: Normal lids and conjunctive, non-icteric sclera, PERRLA Ears, Nose, Mouth, Throat: Normal external inspection ears/nares/mouth/lips/gums, normal TM, MMM; posterior pharynx without erythema, without exudate Neck: No masses, trachea midline. No thyroid enlargement/tenderness/mass appreciated, normal lymph nodes Respiratory: Normal respiratory effort. No wheeze/rhonchi/rales Cardiovascular: S1/S2 normal, no murmur/rub/gallop auscultated. RRR. No carotid bruit or JVD. No lower extremity edema.    ASSESSMENT/PLAN:  Viral upper respiratory illness - Plan: methylPREDNISolone (MEDROL DOSEPAK) 4 MG TBPK tablet, benzonatate (TESSALON) 200 MG capsule, HYDROcodone-homatropine (HYCODAN) 5-1.5 MG/5ML syrup    Return if symptoms worsen or fail to improve.

## 2015-01-12 ENCOUNTER — Telehealth: Payer: Self-pay

## 2015-01-12 DIAGNOSIS — J019 Acute sinusitis, unspecified: Secondary | ICD-10-CM

## 2015-01-12 MED ORDER — AMOXICILLIN-POT CLAVULANATE 875-125 MG PO TABS: 1.0000 | ORAL_TABLET | Freq: Two times a day (BID) | ORAL | Status: DC

## 2015-01-12 NOTE — Telephone Encounter (Signed)
Ok, sent in antibiotics, please call pt and let him know. Thanks!Marland Kitchen

## 2015-01-12 NOTE — Telephone Encounter (Signed)
Randy Mitchell called and left a message stating he is still sick with a cough and congestion. He states he was advised to call back if not any better.

## 2015-01-12 NOTE — Telephone Encounter (Signed)
Patient advised.

## 2015-01-19 ENCOUNTER — Telehealth: Payer: Self-pay

## 2015-01-19 NOTE — Telephone Encounter (Signed)
Would recommend he come in for reevaluation in clinic if he is concerned.

## 2015-01-20 ENCOUNTER — Encounter: Payer: Self-pay | Admitting: Osteopathic Medicine

## 2015-01-20 ENCOUNTER — Ambulatory Visit (INDEPENDENT_AMBULATORY_CARE_PROVIDER_SITE_OTHER): Payer: BLUE CROSS/BLUE SHIELD | Admitting: Osteopathic Medicine

## 2015-01-20 ENCOUNTER — Ambulatory Visit (INDEPENDENT_AMBULATORY_CARE_PROVIDER_SITE_OTHER): Payer: BLUE CROSS/BLUE SHIELD

## 2015-01-20 VITALS — BP 134/85 | HR 84 | Temp 98.2°F | Ht 71.0 in | Wt 269.0 lb

## 2015-01-20 DIAGNOSIS — J329 Chronic sinusitis, unspecified: Secondary | ICD-10-CM

## 2015-01-20 DIAGNOSIS — R059 Cough, unspecified: Secondary | ICD-10-CM

## 2015-01-20 DIAGNOSIS — R0989 Other specified symptoms and signs involving the circulatory and respiratory systems: Secondary | ICD-10-CM | POA: Diagnosis not present

## 2015-01-20 DIAGNOSIS — R05 Cough: Secondary | ICD-10-CM

## 2015-01-20 MED ORDER — IPRATROPIUM BROMIDE 0.03 % NA SOLN: 2.0000 | Freq: Two times a day (BID) | NASAL | Status: DC

## 2015-01-20 MED ORDER — LEVOFLOXACIN 500 MG PO TABS
500.0000 mg | ORAL_TABLET | Freq: Every day | ORAL | Status: DC
Start: 2015-01-20 — End: 2015-04-12

## 2015-01-20 MED ORDER — BENZONATATE 200 MG PO CAPS: 200.0000 mg | ORAL_CAPSULE | Freq: Three times a day (TID) | ORAL | Status: DC | PRN

## 2015-01-20 NOTE — Telephone Encounter (Signed)
Left detailed message advising of recommendations.  

## 2015-01-20 NOTE — Progress Notes (Signed)
HPI: Randy Mitchell is a 40 y.o. male who presents to [facility]  today for chief complaint of:  Chief Complaint  Patient presents with  . Cough   . Location: chest . Quality: coughing . Duration: sick for 2 weeks prior to office visit 2 weeks ago, Dx viral URI treat conservatively and call if no better, pt called 1 week ago no better so I sent augmentin, pt finished course of this and was feeling better for a day or two but then started feeling worse again 3 days ago. Coughing and congestion as ll as chills/aches and sweats. No fever at home. Taking OTC Dayquil/Nyquil, (+) fatigue,  . Assoc signs/symptoms: no fever/chills, no productive cough, Yes  body aches, No  GI upset, Yes fatigue   Past medical, social and family history reviewed: Past Medical History  Diagnosis Date  . Hypertension 2004    was able to lose 40lbs and come off of BP  meds   Past Surgical History  Procedure Laterality Date  . Tonsillectomy and adenoidectomy     Social History  Substance Use Topics  . Smoking status: Never Smoker   . Smokeless tobacco: Not on file  . Alcohol Use: Yes   Family History  Problem Relation Age of Onset  . Hyperlipidemia Father   . Hypertension Father   . Stroke Father   . Hypertension Brother   . Stroke Brother   . Cancer Maternal Grandfather     lung  . Heart disease Maternal Grandfather     Current Outpatient Prescriptions  Medication Sig Dispense Refill  . amoxicillin-clavulanate (AUGMENTIN) 875-125 MG tablet Take 1 tablet by mouth 2 (two) times daily. 10 tablet 0  . benzonatate (TESSALON) 200 MG capsule Take 1 capsule (200 mg total) by mouth 2 (two) times daily as needed for cough (in daytime). 20 capsule 0  . HYDROcodone-homatropine (HYCODAN) 5-1.5 MG/5ML syrup Take 5 mLs by mouth every 8 (eight) hours as needed for cough (in evenings/nighttime). 120 mL 0  . methylPREDNISolone (MEDROL DOSEPAK) 4 MG TBPK tablet 6-day pack as  directed 21 tablet 0   No current facility-administered medications for this visit.   No Known Allergies    Review of Systems: CONSTITUTIONAL: no fever/chills HEAD/EYES/EARS/NOSE/THROAT: no headache, no vision change or hearing change, yes sore throat CARDIAC: No chest pain/pressure/palpitations, no orthopnea RESPIRATORY: yes cough, no shortness of breath GASTROINTESTINAL: no nausea, no vomiting, no abdominal pain/blood in stool/diarrhea/constipation MUSCULOSKELETAL: no myalgia/arthralgia   Exam:  BP 134/85 mmHg  Pulse 84  Temp(Src) 98.2 F (36.8 C) (Oral)  Ht 5\' 11"  (1.803 m)  Wt 269 lb (122.018 kg)  BMI 37.53 kg/m2 Constitutional: VSS, see above. General Appearance: alert, well-developed, well-nourished, NAD Eyes: Normal lids and conjunctive, non-icteric sclera, PERRLA Ears, Nose, Mouth, Throat: Normal external inspection ears/nares/mouth/lips/gums, (+)cerumen obscures bl TM, MMM; posterior pharynx with erythema, without exudate Neck: No masses, trachea midline. No thyroid enlargement/tenderness/mass appreciated, normal lymph nodes Respiratory: Normal respiratory effort. No  wheeze/rhonchi/rales Cardiovascular: S1/S2 normal, no murmur/rub/gallop auscultated. RRR. No carotid bruit or JVD. No lower extremity edema.  CXR on personal review Cardiomediastinal silhouette/heart size: normal Obvious bony abnormality: none Infiltrate: none Mass or other opacity: none Atelectasis: none Diaphragms: normal Lateral view: normal Images were reviewed with the patient. Pt counseled that radiologist will review the images as well, our office will call if the formal read reveals any significant findings other than what has been noted above.    ASSESSMENT/PLAN: CXR appears clear,  Sinus congestion concern for new viral infection vs recurrence, advised escalate abx therapy and if no better consider CT sinus  Coughing - Plan: DG Chest 2 View, benzonatate (TESSALON) 200 MG capsule  Recurrent  sinusitis - Plan: levofloxacin (LEVAQUIN) 500 MG tablet, ipratropium (ATROVENT) 0.03 % nasal spray    No Follow-up on file.

## 2015-04-12 ENCOUNTER — Ambulatory Visit (INDEPENDENT_AMBULATORY_CARE_PROVIDER_SITE_OTHER): Payer: BLUE CROSS/BLUE SHIELD

## 2015-04-12 ENCOUNTER — Ambulatory Visit (INDEPENDENT_AMBULATORY_CARE_PROVIDER_SITE_OTHER): Payer: BLUE CROSS/BLUE SHIELD | Admitting: Family Medicine

## 2015-04-12 ENCOUNTER — Encounter: Payer: Self-pay | Admitting: Family Medicine

## 2015-04-12 VITALS — BP 135/83 | HR 89 | Wt 274.0 lb

## 2015-04-12 DIAGNOSIS — R1030 Lower abdominal pain, unspecified: Secondary | ICD-10-CM

## 2015-04-12 LAB — CBC WITH DIFFERENTIAL/PLATELET
BASOS ABS: 116 {cells}/uL (ref 0–200)
BASOS PCT: 1 %
EOS PCT: 2 %
Eosinophils Absolute: 232 cells/uL (ref 15–500)
HCT: 45.4 % (ref 38.5–50.0)
Hemoglobin: 16.3 g/dL (ref 13.2–17.1)
LYMPHS PCT: 31 %
Lymphs Abs: 3596 cells/uL (ref 850–3900)
MCH: 32.1 pg (ref 27.0–33.0)
MCHC: 35.9 g/dL (ref 32.0–36.0)
MCV: 89.4 fL (ref 80.0–100.0)
MONOS PCT: 11 %
MPV: 9.3 fL (ref 7.5–12.5)
Monocytes Absolute: 1276 cells/uL — ABNORMAL HIGH (ref 200–950)
NEUTROS ABS: 6380 {cells}/uL (ref 1500–7800)
Neutrophils Relative %: 55 %
PLATELETS: 268 10*3/uL (ref 140–400)
RBC: 5.08 MIL/uL (ref 4.20–5.80)
RDW: 13.3 % (ref 11.0–15.0)
WBC: 11.6 10*3/uL — ABNORMAL HIGH (ref 3.8–10.8)

## 2015-04-12 NOTE — Progress Notes (Signed)
Subjective:    Patient ID: Randy Mitchell, male    DOB: 14-Oct-1975, 40 y.o.   MRN: [id number]  HPI C/O lower abdominal pain x 10 days. Started initially with nausea for about 3 days.  Took OTC Immitrol.  Then pain started. No sharp or dull pain.  Feels like a "deep" pain. No triggered by eating or not eating.  No pattern to the pain.  Did try eating more bland foods and didn't seem to make a difference. Didn't try anything for pain.  Some dec appetite.  No change in bowels.  No tenderness.  No rash.  Pain is bilaterally.  He has had some intermittent nausea but he says it usually is brief. No family history of GI issues.   Review of Systems No cough or shortness of breath. No dysphagia.No urinary sxs.    BP 135/83 mmHg  Pulse 89  Wt 274 lb (124.286 kg)  SpO2 98%    No Known Allergies  Past Medical History  Diagnosis Date  . Hypertension 2004    was able to lose 40lbs and come off of BP  meds    Past Surgical History  Procedure Laterality Date  . Tonsillectomy and adenoidectomy      Social History   Social History  . Marital Status: Married    Spouse Name: N/A  . Number of Children: N/A  . Years of Education: N/A   Occupational History  . Not on file.   Social History Main Topics  . Smoking status: Never Smoker   . Smokeless tobacco: Not on file  . Alcohol Use: Yes  . Drug Use: No  . Sexual Activity: Not on file   Other Topics Concern  . Not on file   Social History Narrative    Family History  Problem Relation Age of Onset  . Hyperlipidemia Father   . Hypertension Father   . Stroke Father   . Hypertension Brother   . Stroke Brother   . Cancer Maternal Grandfather     lung  . Heart disease Maternal Grandfather     Outpatient Encounter Prescriptions as of 04/12/2015  Medication Sig  . [DISCONTINUED] benzonatate (TESSALON) 200 MG capsule Take 1 capsule (200 mg total) by mouth 3 (three) times daily as needed for cough.  . [DISCONTINUED] ipratropium  (ATROVENT) 0.03 % nasal spray Place 2 sprays into both nostrils every 12 (twelve) hours.  . [DISCONTINUED] levofloxacin (LEVAQUIN) 500 MG tablet Take 1 tablet (500 mg total) by mouth daily.   No facility-administered encounter medications on file as of 04/12/2015.          Objective:   Physical Exam  Constitutional: He is oriented to person, place, and time. He appears well-developed and well-nourished.  HENT:  Head: Normocephalic and atraumatic.  Cardiovascular: Normal rate, regular rhythm and normal heart sounds.   Pulmonary/Chest: Effort normal and breath sounds normal.  Abdominal: Soft. Bowel sounds are normal. He exhibits no distension and no mass. There is no tenderness. There is no rebound and no guarding.  Stretch marks on the adomen  Neurological: He is alert and oriented to person, place, and time.  Skin: Skin is warm and dry.  Psychiatric: He has a normal mood and affect. His behavior is normal.        Assessment & Plan:  Lower abdominal pain - Unclear etiology. Not consistent with appendicitis or cholecystitis or acute colitis. No rebound guarding or tenderness on exam. He is stooling normally and denies any urinary  symptoms. We'll start with CBC. Will check liver enzymes as well as pancreatic enzymes. We'll also do a urinalysis. Call if symptoms get worse. Or if he develops any more nausea or vomits. Will call with results once available.

## 2015-04-13 LAB — COMPLETE METABOLIC PANEL WITH GFR
ALT: 37 U/L (ref 9–46)
AST: 24 U/L (ref 10–40)
Albumin: 4.6 g/dL (ref 3.6–5.1)
Alkaline Phosphatase: 49 U/L (ref 40–115)
BUN: 15 mg/dL (ref 7–25)
CHLORIDE: 100 mmol/L (ref 98–110)
CO2: 25 mmol/L (ref 20–31)
Calcium: 9.5 mg/dL (ref 8.6–10.3)
Creat: 1 mg/dL (ref 0.60–1.35)
GFR, Est African American: >89 mL/min (ref >=60)
GLUCOSE: 87 mg/dL (ref 65–99)
Potassium: 4.1 mmol/L (ref 3.5–5.3)
SODIUM: 137 mmol/L (ref 135–146)
TOTAL PROTEIN: 7.4 g/dL (ref 6.1–8.1)
Total Bilirubin: 0.6 mg/dL (ref 0.2–1.2)

## 2015-04-13 LAB — URINALYSIS
BILIRUBIN URINE: NEGATIVE
GLUCOSE, UA: NEGATIVE
Hgb urine dipstick: NEGATIVE
Ketones, ur: NEGATIVE
Leukocytes, UA: NEGATIVE
NITRITE: NEGATIVE
Protein, ur: NEGATIVE
SPECIFIC GRAVITY, URINE: 1.02 (ref 1.001–1.035)
pH: 6 (ref 5.0–8.0)

## 2015-04-13 LAB — AMYLASE: Amylase: 47 U/L (ref 0–105)

## 2015-04-13 LAB — LIPASE: LIPASE: 27 U/L (ref 7–60)

## 2015-04-21 ENCOUNTER — Ambulatory Visit (HOSPITAL_BASED_OUTPATIENT_CLINIC_OR_DEPARTMENT_OTHER)
Admission: RE | Admit: 2015-04-21 | Discharge: 2015-04-21 | Disposition: A | Discharge status: Home / Self Care | Payer: BLUE CROSS/BLUE SHIELD | Source: Ambulatory Visit | Attending: Family Medicine | Admitting: Family Medicine

## 2015-04-21 ENCOUNTER — Encounter: Payer: Self-pay | Admitting: Family Medicine

## 2015-04-21 ENCOUNTER — Telehealth: Payer: Self-pay | Admitting: Family Medicine

## 2015-04-21 ENCOUNTER — Ambulatory Visit (INDEPENDENT_AMBULATORY_CARE_PROVIDER_SITE_OTHER): Payer: BLUE CROSS/BLUE SHIELD | Admitting: Family Medicine

## 2015-04-21 VITALS — BP 137/83 | HR 87 | Temp 97.9°F | Wt 276.0 lb

## 2015-04-21 DIAGNOSIS — R1084 Generalized abdominal pain: Secondary | ICD-10-CM

## 2015-04-21 DIAGNOSIS — K4090 Unilateral inguinal hernia, without obstruction or gangrene, not specified as recurrent: Secondary | ICD-10-CM | POA: Diagnosis not present

## 2015-04-21 DIAGNOSIS — R109 Unspecified abdominal pain: Secondary | ICD-10-CM | POA: Insufficient documentation

## 2015-04-21 DIAGNOSIS — K760 Fatty (change of) liver, not elsewhere classified: Secondary | ICD-10-CM | POA: Diagnosis not present

## 2015-04-21 DIAGNOSIS — K7581 Nonalcoholic steatohepatitis (NASH): Secondary | ICD-10-CM | POA: Insufficient documentation

## 2015-04-21 LAB — COMPREHENSIVE METABOLIC PANEL
ALBUMIN: 4.6 g/dL (ref 3.6–5.1)
ALT: 32 U/L (ref 9–46)
AST: 22 U/L (ref 10–40)
Alkaline Phosphatase: 54 U/L (ref 40–115)
BILIRUBIN TOTAL: 0.6 mg/dL (ref 0.2–1.2)
BUN: 12 mg/dL (ref 7–25)
CO2: 24 mmol/L (ref 20–31)
CREATININE: 0.93 mg/dL (ref 0.60–1.35)
Calcium: 9.5 mg/dL (ref 8.6–10.3)
Chloride: 102 mmol/L (ref 98–110)
GLUCOSE: 86 mg/dL (ref 65–99)
Potassium: 4.4 mmol/L (ref 3.5–5.3)
SODIUM: 139 mmol/L (ref 135–146)
Total Protein: 7.3 g/dL (ref 6.1–8.1)

## 2015-04-21 LAB — CBC
HCT: 44.8 % (ref 38.5–50.0)
HEMOGLOBIN: 15.5 g/dL (ref 13.2–17.1)
MCH: 30.6 pg (ref 27.0–33.0)
MCHC: 34.6 g/dL (ref 32.0–36.0)
MCV: 88.5 fL (ref 80.0–100.0)
MPV: 9.4 fL (ref 7.5–12.5)
Platelets: 258 10*3/uL (ref 140–400)
RBC: 5.06 MIL/uL (ref 4.20–5.80)
RDW: 13.2 % (ref 11.0–15.0)
WBC: 8.9 10*3/uL (ref 3.8–10.8)

## 2015-04-21 LAB — LIPASE: Lipase: 21 U/L (ref 7–60)

## 2015-04-21 MED ORDER — IOPAMIDOL (ISOVUE-300) INJECTION 61%
100.0000 mL | Freq: Once | INTRAVENOUS | Status: AC | PRN
  Administered 2015-04-21: 100 mL via INTRAVENOUS

## 2015-04-21 NOTE — Telephone Encounter (Signed)
CT scan ordered to eval abd pain. We will try to contact the patient to have this done.

## 2015-04-21 NOTE — Progress Notes (Signed)
Quick Note:  Gallbladder is completely normal and ultrasound. Liver shows fatty liver disease which is usual with being overweight. I would recommend a CT scan. This test has been ordered. ______

## 2015-04-21 NOTE — Patient Instructions (Signed)
Thank you for coming in today. If your belly pain worsens, or you have high fever, bad vomiting, blood in your stool or black tarry stool go to the Emergency Room.  You can take tylenol.  Go to the [facility].  We will call with results.   Abdominal Pain, Adult Many things can cause abdominal pain. Usually, abdominal pain is not caused by a disease and will improve without treatment. It can often be observed and treated at home. Your health care provider will do a physical exam and possibly order blood tests and X-rays to help determine the seriousness of your pain. However, in many cases, more time must pass before a clear cause of the pain can be found. Before that point, your health care provider may not know if you need more testing or further treatment. HOME CARE INSTRUCTIONS Monitor your abdominal pain for any changes. The following actions may help to alleviate any discomfort you are experiencing:  Only take over-the-counter or prescription medicines as directed by your health care provider.  Do not take laxatives unless directed to do so by your health care provider.  Try a clear liquid diet (broth, tea, or water) as directed by your health care provider. Slowly move to a bland diet as tolerated. SEEK MEDICAL CARE IF:  You have unexplained abdominal pain.  You have abdominal pain associated with nausea or diarrhea.  You have pain when you urinate or have a bowel movement.  You experience abdominal pain that wakes you in the night.  You have abdominal pain that is worsened or improved by eating food.  You have abdominal pain that is worsened with eating fatty foods.  You have a fever. SEEK IMMEDIATE MEDICAL CARE IF:  Your pain does not go away within 2 hours.  You keep throwing up (vomiting).  Your pain is felt only in portions of the abdomen, such as the right side or the left lower portion of the abdomen.  You pass bloody or black tarry stools. MAKE SURE  YOU:  Understand these instructions.  Will watch your condition.  Will get help right away if you are not doing well or get worse.   This information is not intended to replace advice given to you by your health care provider. Make sure you discuss any questions you have with your health care provider.   Document Released: 10/04/2004 Document Revised: 09/15/2014 Document Reviewed: 09/03/2012 Elsevier Interactive Patient Education [employer].

## 2015-04-21 NOTE — Progress Notes (Signed)
       Randy Mitchell is a 40 y.o. male who presents to [facility] today for abdominal pain. Patient notes a 20 day history of mild to moderate generalized abdominal pain. He notes the pain typically is mild in the morning and worsens throughout the day. He notes the pain does not seem to be related to food or activity. He denies any vomiting or diarrhea or blood in the stool. No fevers or chills. He was seen by his PCP on April 4 where his labs were largely normal. He notes the symptoms continue but are not particularly worse.   Past Medical History  Diagnosis Date  . Hypertension 2004    was able to lose 40lbs and come off of BP  meds   Past Surgical History  Procedure Laterality Date  . Tonsillectomy and adenoidectomy     Social History  Substance Use Topics  . Smoking status: Never Smoker   . Smokeless tobacco: Not on file  . Alcohol Use: Yes   family history includes Cancer in his maternal grandfather; Heart disease in his maternal grandfather; Hyperlipidemia in his father; Hypertension in his brother and father; Stroke in his brother and father.  ROS as above Medications: No current outpatient prescriptions on file.   No current facility-administered medications for this visit.   No Known Allergies   Exam:  BP 137/83 mmHg  Pulse 87  Temp(Src) 97.9 F (36.6 C) (Oral)  Wt 276 lb (125.193 kg)  SpO2 100% Gen: Well NAD Nontoxic appearing  HEENT: EOMI,  MMM Lungs: Normal work of breathing. CTABL Heart: RRR no MRG Abd: NABS, Soft. Nondistended, Nontender Exts: Brisk capillary refill, warm and well perfused.   No results found for this or any previous visit (from the past 24 hour(s)). No results found.   Please see individual assessment and plan sections.

## 2015-04-21 NOTE — Assessment & Plan Note (Signed)
Unclear etiology. Plan for repeat CBC CMP and lipase. Obtain abdominal ultrasound. If all of these labs and imaging tests are normal would proceed with a H. pylori breath test and CT scan of the abdomen and pelvis. Recheck next week if not better.

## 2015-04-21 NOTE — Telephone Encounter (Signed)
Authorization approval, imaging notified- they will contact Pt to get scheduled today at [place].

## 2015-04-22 ENCOUNTER — Encounter: Payer: Self-pay | Admitting: Family Medicine

## 2015-04-22 DIAGNOSIS — K4090 Unilateral inguinal hernia, without obstruction or gangrene, not specified as recurrent: Secondary | ICD-10-CM | POA: Insufficient documentation

## 2015-04-22 NOTE — Progress Notes (Signed)
Quick Note:  CT is all pretty normal. There is a mild left inguinal hernia but I dont think it is causing the pain. Follow with with myself or Dr Jerilynn Mages to go over all the findings and come up with a plan. ______

## 2015-04-25 ENCOUNTER — Encounter: Payer: Self-pay | Admitting: Family Medicine

## 2015-04-25 ENCOUNTER — Ambulatory Visit (INDEPENDENT_AMBULATORY_CARE_PROVIDER_SITE_OTHER): Payer: BLUE CROSS/BLUE SHIELD | Admitting: Family Medicine

## 2015-04-25 VITALS — BP 116/75 | HR 87 | Wt 274.0 lb

## 2015-04-25 DIAGNOSIS — K760 Fatty (change of) liver, not elsewhere classified: Secondary | ICD-10-CM | POA: Diagnosis not present

## 2015-04-25 DIAGNOSIS — R1084 Generalized abdominal pain: Secondary | ICD-10-CM | POA: Diagnosis not present

## 2015-04-25 MED ORDER — DICYCLOMINE HCL 10 MG PO CAPS: 10.0000 mg | ORAL_CAPSULE | Freq: Three times a day (TID) | ORAL | Status: DC | PRN

## 2015-04-25 NOTE — Progress Notes (Signed)
   Subjective:    Patient ID: Randy Mitchell, male    DOB: October 17, 1975, 40 y.o.   MRN: [id number]  HPI  pt reports that he was experiencing pain over the W/E but much better today. he has a CT scheduled for Thursday.   He says overall his abdominal pain is a little bit better than it was previously. When he first came in he complained predominantly of more lower abdominal pain below the bellybutton. Now he says it's more to the sides of the bellybutton and a little bit more tender on the left side compared to the right. No fevers or chills. No blood in the stool. He has had normal bowel movements. No constipation or diarrhea. He has had some bloating and more gas than usual. He did have a CT scan per scan performed last Thursday. Did not show any acute abnormalities. The appendix was normal. It did demonstrate fatty liver and there was a left inguinal hernia containing some fat but that should not be causing his pain. He has not had any heartburn or reflux symptoms. He is not taking any H2 blockers or PPIs. That it was more intense pain over the weekend but actually feels a little bit better today. He said in fact he actually didn't go to a family event because of the pain and wants to know if there something that he can take when this happens.   Review of Systems     Objective:   Physical Exam  Constitutional: He is oriented to person, place, and time. He appears well-developed and well-nourished.  HENT:  Head: Normocephalic and atraumatic.  Cardiovascular: Normal rate, regular rhythm and normal heart sounds.   Pulmonary/Chest: Effort normal and breath sounds normal.  Abdominal: Soft. Bowel sounds are normal. He exhibits no distension and no mass. There is tenderness. There is no rebound and no guarding.  Mild tenderness in the left upper quadrant.   Neurological: He is alert and oriented to person, place, and time.  Skin: Skin is warm and dry.  Psychiatric: He has a normal mood and affect. His  behavior is normal.          Assessment & Plan:  Lower abdominal pain-still unclear etiology with a fairly normal CT scan at this point. We did discuss using dicyclomine as needed for lower abdominal pain and cramping. We'll go ahead and do test for H. pylori. Consider gastritis versus gastric ulcer that his pain is a little lower than would be expected. Can try dicyclomine prn for pain and spasms.  If not improving will refer to GI for further work up.    Fatty liver disease-discussed diagnosis and main treatment options including diet and weight loss.

## 2015-04-26 LAB — H. PYLORI BREATH TEST: H. PYLORI BREATH TEST: NOT DETECTED

## 2015-05-12 ENCOUNTER — Telehealth: Payer: Self-pay

## 2015-05-12 DIAGNOSIS — R1084 Generalized abdominal pain: Secondary | ICD-10-CM

## 2015-05-12 NOTE — Telephone Encounter (Signed)
Wyman is still taking the dicyclomine. He wants to know if he should still take medication or come back in for evaluation. He states he still has stomach pains if he misses a dose and he will need a refill. Please advise.

## 2015-05-13 MED ORDER — DICYCLOMINE HCL 10 MG PO CAPS: 10.0000 mg | ORAL_CAPSULE | Freq: Three times a day (TID) | ORAL | Status: AC | PRN

## 2015-05-13 NOTE — Telephone Encounter (Signed)
Referral placed, Pt requested refill on Bentyl to last until he can get into GI. Sent refill.

## 2015-05-13 NOTE — Telephone Encounter (Signed)
I was hoping by now that his pain would have subsided. Since he still experiencing pain if he misses the medication that I really think we should send him to GI for further evaluation. Please place referral if he is okay with this.

## 2015-10-01 ENCOUNTER — Encounter: Payer: Self-pay | Admitting: Emergency Medicine

## 2015-10-01 ENCOUNTER — Emergency Department (INDEPENDENT_AMBULATORY_CARE_PROVIDER_SITE_OTHER)
Admission: EM | Admit: 2015-10-01 | Discharge: 2015-10-01 | Disposition: A | Discharge status: Home / Self Care | Payer: BLUE CROSS/BLUE SHIELD | Source: Home / Self Care | Attending: Family Medicine | Admitting: Family Medicine

## 2015-10-01 DIAGNOSIS — R42 Dizziness and giddiness: Secondary | ICD-10-CM

## 2015-10-01 DIAGNOSIS — J019 Acute sinusitis, unspecified: Secondary | ICD-10-CM | POA: Diagnosis not present

## 2015-10-01 MED ORDER — PREDNISONE 20 MG PO TABS: ORAL_TABLET | ORAL | 0 refills | Status: AC

## 2015-10-01 MED ORDER — AMOXICILLIN-POT CLAVULANATE 875-125 MG PO TABS: 1.0000 | ORAL_TABLET | Freq: Two times a day (BID) | ORAL | 0 refills | Status: AC

## 2015-10-01 MED ORDER — SALINE SPRAY 0.65 % NA SOLN: 1.0000 | NASAL | 0 refills | Status: AC | PRN

## 2015-10-01 MED ORDER — MECLIZINE HCL 25 MG PO TABS: 25.0000 mg | ORAL_TABLET | Freq: Three times a day (TID) | ORAL | 0 refills | Status: AC | PRN

## 2015-10-01 NOTE — ED Triage Notes (Signed)
Pt c/o dizziness and lightheadedness that is not asociated with anything. Started randomly x3 days ago. Also having some intermittent sinus pressure.

## 2015-10-01 NOTE — ED Provider Notes (Signed)
CSN: [id number]     Arrival date & time 10/01/15  1253 History   First MD Initiated Contact with Patient 10/01/15 1324     Chief Complaint  Patient presents with  . Dizziness   (Consider location/radiation/quality/duration/timing/severity/associated sxs/prior Treatment) HPI Randy Mitchell is a 40 y.o. male presenting to UC with c/o 3 days of intermittent dizziness and lightheadedness that was worst last night while sitting at home.  He reports having mild nasal congestion and sinus pressure so he started taking Sudafed 2 days ago but no relief.  Denies fever, chills, n/v/d. Denies ear pain or sore throat. No numbness or weakness in arms or legs. Denies headache or change in vision.     Past Medical History:  Diagnosis Date  . Hypertension 2004   was able to lose 40lbs and come off of BP  meds   Past Surgical History:  Procedure Laterality Date  . TONSILLECTOMY AND ADENOIDECTOMY     Family History  Problem Relation Age of Onset  . Hyperlipidemia Father   . Hypertension Father   . Stroke Father   . Hypertension Brother   . Stroke Brother   . Cancer Maternal Grandfather     lung  . Heart disease Maternal Grandfather    Social History  Substance Use Topics  . Smoking status: Never Smoker  . Smokeless tobacco: Never Used  . Alcohol use Yes    Review of Systems  Constitutional: Negative for chills and fever.  HENT: Positive for congestion ( minimal) and sinus pressure. Negative for ear pain, sore throat, trouble swallowing and voice change.   Respiratory: Negative for cough and shortness of breath.   Cardiovascular: Negative for chest pain and palpitations.  Gastrointestinal: Negative for abdominal pain, diarrhea, nausea and vomiting.  Musculoskeletal: Negative for arthralgias, back pain and myalgias.  Skin: Negative for rash.  Neurological: Positive for dizziness and light-headedness. Negative for headaches.    Allergies  Review of patient's allergies indicates no known  allergies.  Home Medications   Prior to Admission medications   Medication Sig Start Date End Date Taking? Authorizing Provider  amoxicillin-clavulanate (AUGMENTIN) 875-125 MG tablet Take 1 tablet by mouth 2 (two) times daily. One po bid x 7 days 10/01/15   Noland Fordyce, PA-C  dicyclomine (BENTYL) 10 MG capsule Take 1 capsule (10 mg total) by mouth 3 (three) times daily as needed for spasms. 05/13/15   Hali Marry, MD  meclizine (ANTIVERT) 25 MG tablet Take 1 tablet (25 mg total) by mouth 3 (three) times daily as needed for dizziness. 10/01/15   Noland Fordyce, PA-C  predniSONE (DELTASONE) 20 MG tablet 3 tabs po day one, then 2 po daily x 4 days 10/01/15   Noland Fordyce, PA-C  sodium chloride (OCEAN) 0.65 % SOLN nasal spray Place 1 spray into both nostrils as needed. 10/01/15   Noland Fordyce, PA-C   Meds Ordered and Administered this Visit  Medications - No data to display  BP 139/96 (BP Location: Right Arm)   Pulse 100   Temp 98 F (36.7 C) (Oral)   Wt 273 lb (123.8 kg)   SpO2 98%   BMI 38.08 kg/m  No data found.   Physical Exam  Constitutional: He is oriented to person, place, and time. He appears well-developed and well-nourished. No distress.  HENT:  Head: Normocephalic and atraumatic.  Right Ear: Tympanic membrane normal.  Left Ear: Tympanic membrane normal.  Nose: Mucosal edema present. Right sinus exhibits maxillary sinus tenderness and frontal sinus  tenderness. Left sinus exhibits maxillary sinus tenderness and frontal sinus tenderness.  Mouth/Throat: Uvula is midline, oropharynx is clear and moist and mucous membranes are normal.  Mild sinus tenderness.  Eyes: Conjunctivae and EOM are normal. Pupils are equal, round, and reactive to light. No scleral icterus.  Neck: Normal range of motion. Neck supple.  Cardiovascular: Normal rate, regular rhythm and normal heart sounds.   Pulmonary/Chest: Effort normal and breath sounds normal. No respiratory distress. He has no  wheezes. He has no rales.  Abdominal: Soft. He exhibits no distension. There is no tenderness.  Musculoskeletal: Normal range of motion.  Neurological: He is alert and oriented to person, place, and time. Gait normal. GCS eye subscore is 4. GCS verbal subscore is 5. GCS motor subscore is 6.  Skin: Skin is warm and dry. He is not diaphoretic.  Nursing note and vitals reviewed.   Urgent Care Course   Clinical Course    Procedures (including critical care time)  Labs Review Labs Reviewed - No data to display  Imaging Review No results found.    MDM   1. Acute rhinosinusitis   2. Dizziness    Pt c/o sinus pressure with mild nasal congestion and dizziness. Mild sinus tenderness noted on exam. Normal neuro exam. Vitals: WNL  Dizziness likely due to sinusitis as well as side effect from Sudafed.  Rx: Prednisone, Augmentin, and meclizine as needed for severe dizziness. Home care instructions provided. F/u with PCP in 1 week if not improving, sooner if worsening. Discussed symptoms that warrant emergent care in the ED. Patient verbalized understanding and agreement with treatment plan.     Noland Fordyce, PA-C 10/01/15 1448

## 2015-10-01 NOTE — Discharge Instructions (Signed)
° °  You may stop taking the sudafed as that can sometimes worsen dizziness if it dries you out too much.  You may try sinus rinses, plain mucinex such as guaifenesin, or just taking today's prescribed medications with acetaminophen and ibuprofen to help with any pain or fevers.

## 2017-11-27 IMAGING — CT CT ABD-PELV W/ CM
2 of 4 series · 17 of 46 positions shown, 19 images · IV contrast (APPLIED)
Comparison: None.

CLINICAL DATA: Generalized abdominal pain

EXAM:
CT ABDOMEN AND PELVIS WITH CONTRAST
TECHNIQUE: Multidetector CT imaging of the abdomen and pelvis was performed
using the standard protocol following bolus administration of
intravenous contrast.
CONTRAST:  100mL Z54VHP-G00 IOPAMIDOL (Z54VHP-G00) INJECTION 61%

[Series 2: axial st · axial · 0.98mm/px · z∈[-506,-36]mm · 14 of 104 slices shown, 16 images]
[im 5/104  soft-tissue]
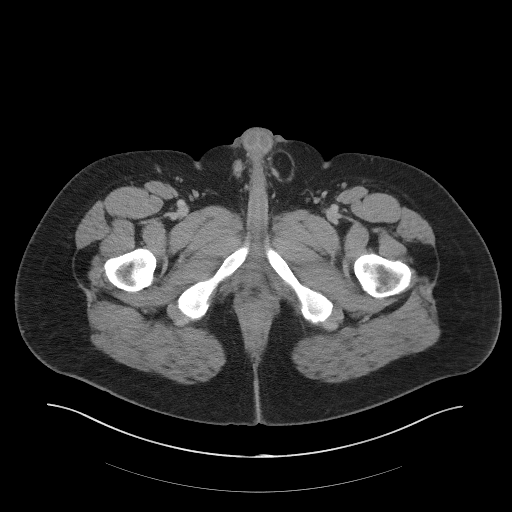
[im 5/104  bone]
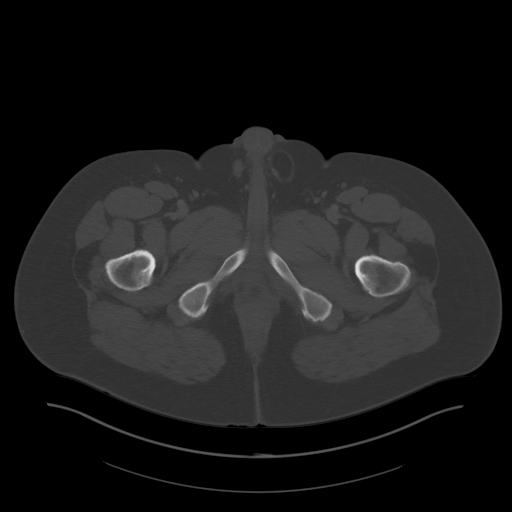
[im 14/104  soft-tissue]
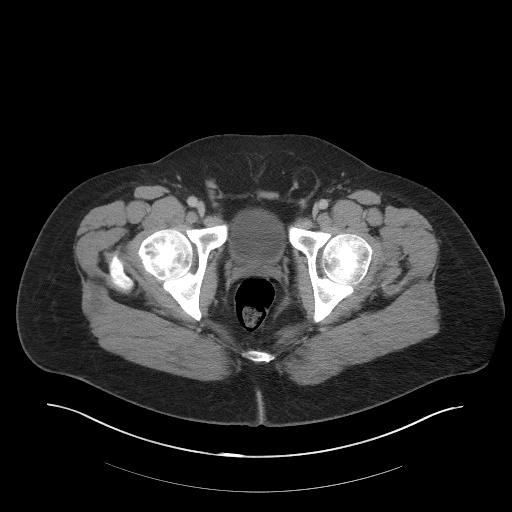
[im 18/104  soft-tissue]
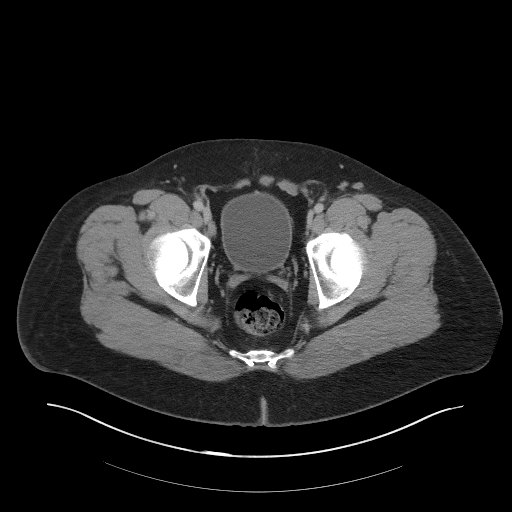
[im 27/104  soft-tissue]
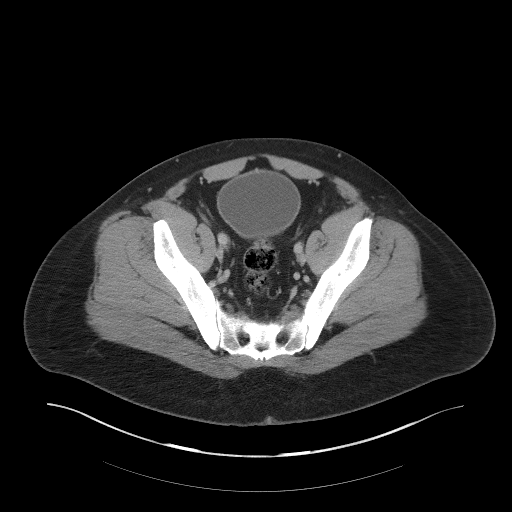
[im 36/104  soft-tissue]
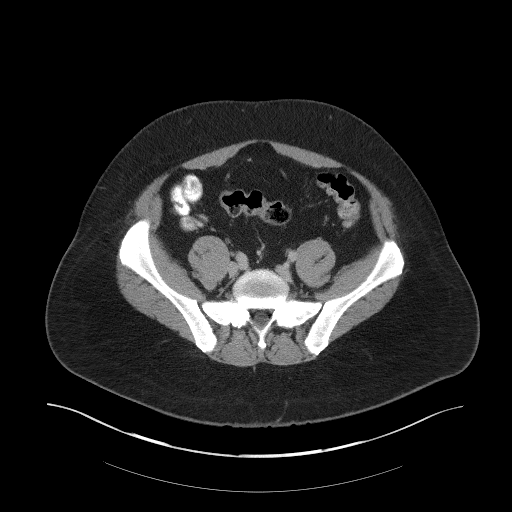
[im 41/104  soft-tissue]
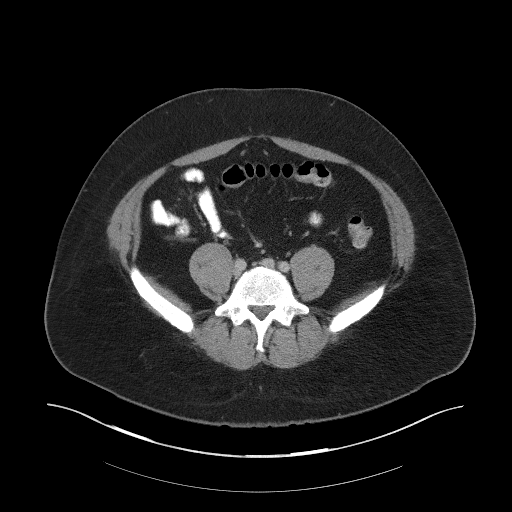
[im 50/104  soft-tissue]
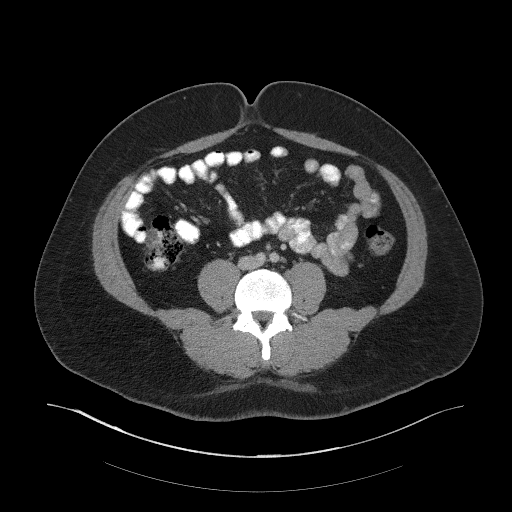
[im 54/104  soft-tissue]
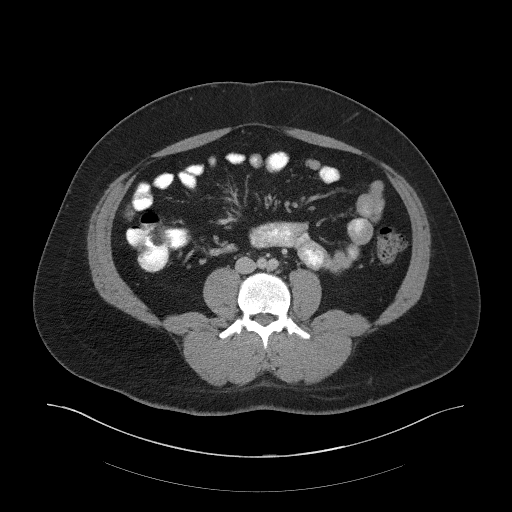
[im 63/104  soft-tissue]
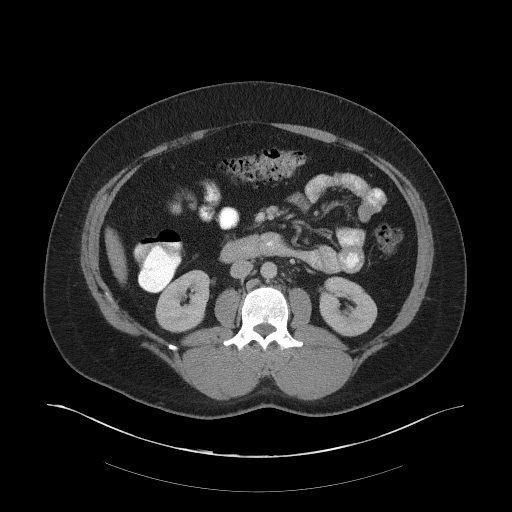
[im 63/104  bone]
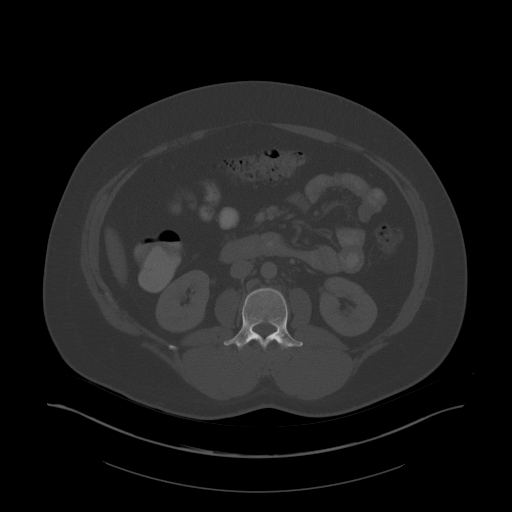
[im 68/104  soft-tissue]
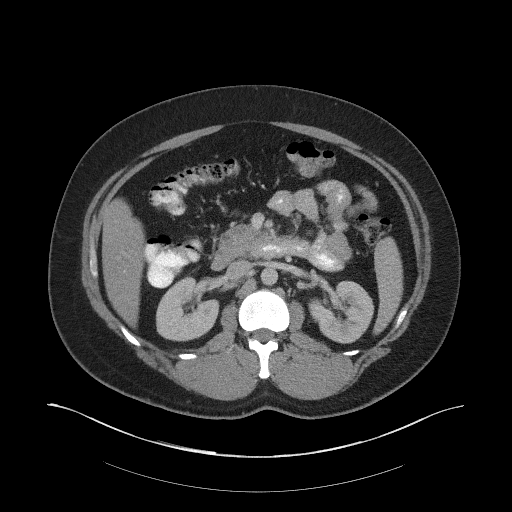
[im 77/104  soft-tissue]
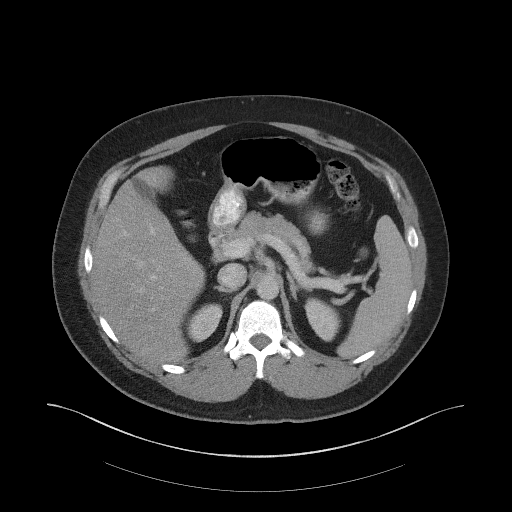
[im 86/104  soft-tissue]
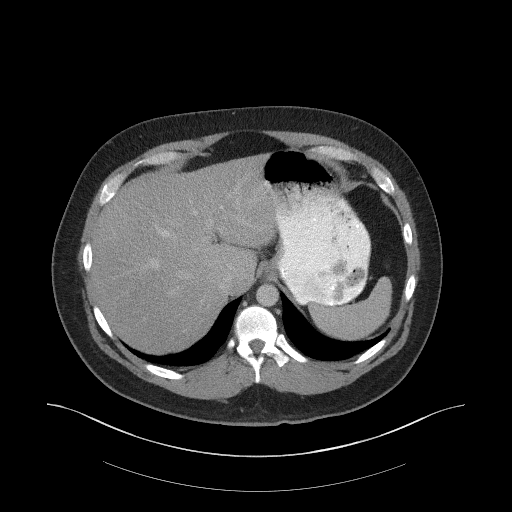
[im 90/104  soft-tissue]
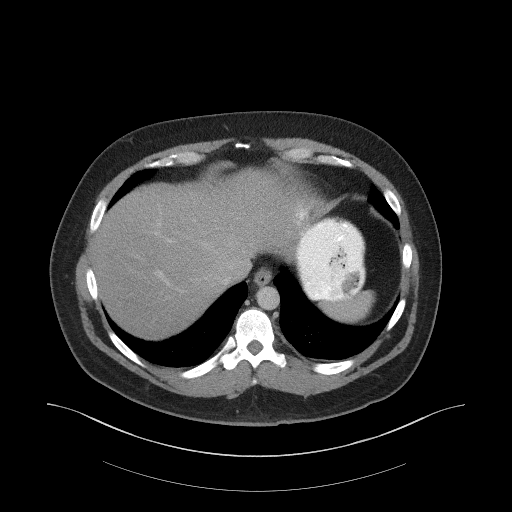
[im 99/104  soft-tissue]
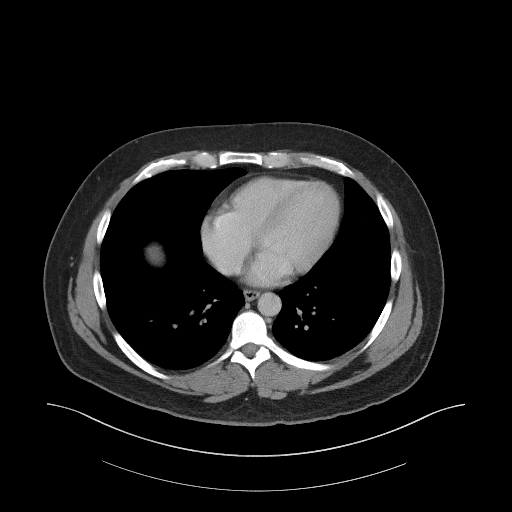

[Series 5: coronal st · coronal · 0.94mm/px · 3 of 102 slices shown]
[im 34/102  soft-tissue]
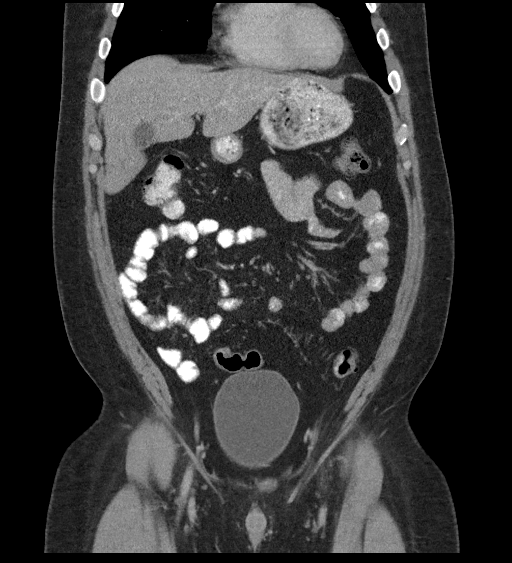
[im 45/102  soft-tissue]
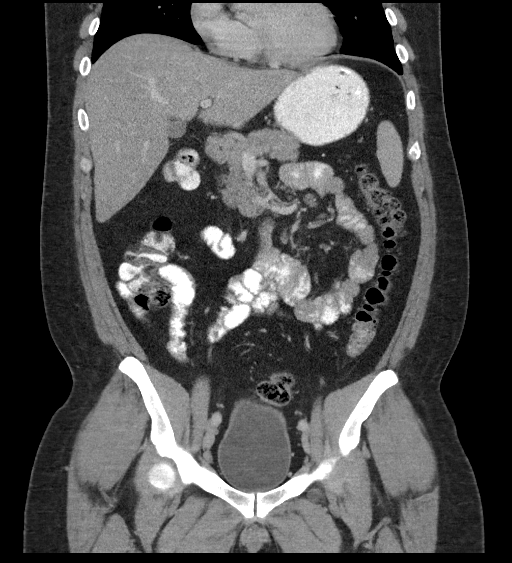
[im 57/102  soft-tissue]
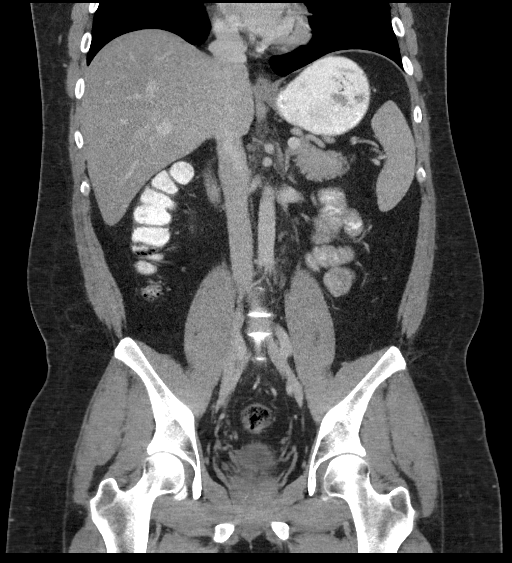

[17 of 46 positions shown; findings below may reference images not displayed]

FINDINGS: Lower chest:  Lung bases clear without infiltrate or effusion

Hepatobiliary: Fatty infiltration of the liver. No focal liver
lesion. Gallbladder and bile ducts normal.

Pancreas: Negative

Spleen: Negative.  15 mm accessory spleen noted.

Adrenals/Urinary Tract: Normal kidneys. No renal obstruction or
mass. No urinary tract calculi. Urinary bladder normal.

Stomach/Bowel: Negative for bowel obstruction. No bowel edema or
mass. Normal appendix.

Vascular/Lymphatic: Normal aorta and IVC.  No lymphadenopathy.

Reproductive: Normal prostate size

Other: Left inguinal hernia containing fat. No free fluid in the
abdomen

Musculoskeletal: Negative
IMPRESSION: No acute abnormality.  Normal appendix

Fatty liver

Left inguinal hernia containing fat.
